# Patient Record
Sex: Male | Born: 1975 | Race: Black or African American | Hispanic: No | Smoking: Former smoker
Health system: Southern US, Community
[De-identification: ages and names within clinical notes are randomized; demographics above are authoritative.]

## PROBLEM LIST (undated history)

## (undated) DIAGNOSIS — N200 Calculus of kidney: Secondary | ICD-10-CM

## (undated) HISTORY — PX: NO PAST SURGERIES: SHX2092

## (undated) HISTORY — PX: KIDNEY STONE SURGERY: SHX686

---

## 1998-01-07 ENCOUNTER — Emergency Department (HOSPITAL_COMMUNITY): Admission: EM | Admit: 1998-01-07 | Discharge: 1998-01-07 | Payer: Self-pay | Admitting: *Deleted

## 1998-02-07 ENCOUNTER — Emergency Department (HOSPITAL_COMMUNITY): Admission: EM | Admit: 1998-02-07 | Discharge: 1998-02-07 | Payer: Self-pay | Admitting: Emergency Medicine

## 1998-02-07 ENCOUNTER — Encounter: Payer: Self-pay | Admitting: Emergency Medicine

## 1998-02-16 ENCOUNTER — Emergency Department (HOSPITAL_COMMUNITY): Admission: EM | Admit: 1998-02-16 | Discharge: 1998-02-16 | Payer: Self-pay | Admitting: Internal Medicine

## 1999-06-06 ENCOUNTER — Emergency Department (HOSPITAL_COMMUNITY): Admission: EM | Admit: 1999-06-06 | Discharge: 1999-06-06 | Payer: Self-pay | Admitting: Emergency Medicine

## 1999-06-06 ENCOUNTER — Encounter: Payer: Self-pay | Admitting: Emergency Medicine

## 2000-04-06 ENCOUNTER — Emergency Department (HOSPITAL_COMMUNITY): Admission: EM | Admit: 2000-04-06 | Discharge: 2000-04-06 | Payer: Self-pay | Admitting: Emergency Medicine

## 2001-07-27 ENCOUNTER — Emergency Department (HOSPITAL_COMMUNITY): Admission: EM | Admit: 2001-07-27 | Discharge: 2001-07-27 | Payer: Self-pay | Admitting: *Deleted

## 2001-07-27 ENCOUNTER — Encounter: Payer: Self-pay | Admitting: *Deleted

## 2001-07-31 ENCOUNTER — Encounter: Payer: Self-pay | Admitting: Emergency Medicine

## 2001-07-31 ENCOUNTER — Emergency Department (HOSPITAL_COMMUNITY): Admission: EM | Admit: 2001-07-31 | Discharge: 2001-07-31 | Payer: Self-pay | Admitting: Emergency Medicine

## 2003-01-16 ENCOUNTER — Emergency Department (HOSPITAL_COMMUNITY): Admission: EM | Admit: 2003-01-16 | Discharge: 2003-01-16 | Payer: Self-pay | Admitting: Emergency Medicine

## 2003-11-09 ENCOUNTER — Emergency Department (HOSPITAL_COMMUNITY): Admission: EM | Admit: 2003-11-09 | Discharge: 2003-11-09 | Payer: Self-pay | Admitting: Emergency Medicine

## 2004-02-28 ENCOUNTER — Emergency Department (HOSPITAL_COMMUNITY): Admission: EM | Admit: 2004-02-28 | Discharge: 2004-02-28 | Payer: Self-pay | Admitting: Family Medicine

## 2004-10-30 ENCOUNTER — Emergency Department (HOSPITAL_COMMUNITY): Admission: EM | Admit: 2004-10-30 | Discharge: 2004-10-30 | Payer: Self-pay | Admitting: Emergency Medicine

## 2006-04-01 ENCOUNTER — Emergency Department (HOSPITAL_COMMUNITY): Admission: EM | Admit: 2006-04-01 | Discharge: 2006-04-01 | Payer: Self-pay | Admitting: Emergency Medicine

## 2006-04-09 ENCOUNTER — Emergency Department (HOSPITAL_COMMUNITY): Admission: EM | Admit: 2006-04-09 | Discharge: 2006-04-09 | Payer: Self-pay | Admitting: Family Medicine

## 2006-06-08 ENCOUNTER — Emergency Department (HOSPITAL_COMMUNITY): Admission: EM | Admit: 2006-06-08 | Discharge: 2006-06-08 | Payer: Self-pay | Admitting: *Deleted

## 2007-12-25 ENCOUNTER — Emergency Department (HOSPITAL_COMMUNITY): Admission: EM | Admit: 2007-12-25 | Discharge: 2007-12-25 | Payer: Self-pay | Admitting: Emergency Medicine

## 2007-12-31 ENCOUNTER — Emergency Department (HOSPITAL_COMMUNITY): Admission: EM | Admit: 2007-12-31 | Discharge: 2007-12-31 | Payer: Self-pay | Admitting: Emergency Medicine

## 2008-04-15 ENCOUNTER — Emergency Department (HOSPITAL_COMMUNITY): Admission: EM | Admit: 2008-04-15 | Discharge: 2008-04-15 | Payer: Self-pay | Admitting: Emergency Medicine

## 2008-08-23 ENCOUNTER — Emergency Department (HOSPITAL_COMMUNITY): Admission: EM | Admit: 2008-08-23 | Discharge: 2008-08-24 | Payer: Self-pay | Admitting: Emergency Medicine

## 2010-05-09 LAB — LIPASE, BLOOD: Lipase: 21 U/L (ref 11–59)

## 2010-05-09 LAB — COMPREHENSIVE METABOLIC PANEL
AST: 23 U/L (ref 0–37)
Alkaline Phosphatase: 55 U/L (ref 39–117)
CO2: 25 mEq/L (ref 19–32)
Calcium: 9.5 mg/dL (ref 8.4–10.5)
Chloride: 104 mEq/L (ref 96–112)
Creatinine, Ser: 0.98 mg/dL (ref 0.4–1.5)
GFR calc Af Amer: >60 mL/min (ref >60)
Sodium: 138 mEq/L (ref 135–145)
Total Bilirubin: 0.8 mg/dL (ref 0.3–1.2)
Total Protein: 7.5 g/dL (ref 6.0–8.3)

## 2010-05-09 LAB — DIFFERENTIAL
Basophils Relative: 0 % (ref 0–1)
Eosinophils Absolute: 0 10*3/uL (ref 0.0–0.7)
Lymphocytes Relative: 5 % — ABNORMAL LOW (ref 12–46)
Neutrophils Relative %: 89 % — ABNORMAL HIGH (ref 43–77)

## 2010-05-09 LAB — CBC
Platelets: 144 10*3/uL — ABNORMAL LOW (ref 150–400)
RDW: 13.5 % (ref 11.5–15.5)

## 2010-12-09 ENCOUNTER — Encounter: Payer: Self-pay | Admitting: *Deleted

## 2010-12-09 ENCOUNTER — Emergency Department (HOSPITAL_COMMUNITY)
Admission: EM | Admit: 2010-12-09 | Discharge: 2010-12-09 | Attending: Emergency Medicine | Admitting: Emergency Medicine

## 2010-12-09 ENCOUNTER — Other Ambulatory Visit: Payer: Self-pay

## 2010-12-09 ENCOUNTER — Emergency Department (HOSPITAL_COMMUNITY)

## 2010-12-09 DIAGNOSIS — R110 Nausea: Secondary | ICD-10-CM | POA: Insufficient documentation

## 2010-12-09 DIAGNOSIS — R0789 Other chest pain: Secondary | ICD-10-CM | POA: Insufficient documentation

## 2010-12-09 DIAGNOSIS — R55 Syncope and collapse: Secondary | ICD-10-CM | POA: Insufficient documentation

## 2010-12-09 HISTORY — DX: Calculus of kidney: N20.0

## 2010-12-09 LAB — DIFFERENTIAL
Basophils Absolute: 0 10*3/uL (ref 0.0–0.1)
Basophils Relative: 0 % (ref 0–1)
Eosinophils Absolute: 0.1 10*3/uL (ref 0.0–0.7)
Eosinophils Relative: 1 % (ref 0–5)
Lymphocytes Relative: 8 % — ABNORMAL LOW (ref 12–46)
Lymphs Abs: 0.6 10*3/uL — ABNORMAL LOW (ref 0.7–4.0)
Monocytes Absolute: 0.4 10*3/uL (ref 0.1–1.0)
Monocytes Relative: 5 % (ref 3–12)
Neutro Abs: 6.6 10*3/uL (ref 1.7–7.7)
Neutrophils Relative %: 87 % — ABNORMAL HIGH (ref 43–77)

## 2010-12-09 LAB — BASIC METABOLIC PANEL
BUN: 14 mg/dL (ref 6–23)
CO2: 29 mEq/L (ref 19–32)
Calcium: 9.2 mg/dL (ref 8.4–10.5)
Chloride: 103 mEq/L (ref 96–112)
Creatinine, Ser: 1.07 mg/dL (ref 0.50–1.35)
GFR calc Af Amer: >90 mL/min (ref >90)
GFR calc non Af Amer: 89 mL/min — ABNORMAL LOW (ref >90)
Glucose, Bld: 116 mg/dL — ABNORMAL HIGH (ref 70–99)
Potassium: 3.9 mEq/L (ref 3.5–5.1)
Sodium: 136 mEq/L (ref 135–145)

## 2010-12-09 LAB — CBC
Hemoglobin: 12.8 g/dL — ABNORMAL LOW (ref 13.0–17.0)
MCV: 88.7 fL (ref 78.0–100.0)
RBC: 4.35 MIL/uL (ref 4.22–5.81)

## 2010-12-09 LAB — URINALYSIS, ROUTINE W REFLEX MICROSCOPIC
Bilirubin Urine: NEGATIVE
Glucose, UA: NEGATIVE mg/dL
Hgb urine dipstick: NEGATIVE
Protein, ur: NEGATIVE mg/dL
Specific Gravity, Urine: 1.015 (ref 1.005–1.030)

## 2010-12-09 LAB — RAPID URINE DRUG SCREEN, HOSP PERFORMED
Amphetamines: NOT DETECTED
Barbiturates: NOT DETECTED
Benzodiazepines: NOT DETECTED
Cocaine: NOT DETECTED
Opiates: NOT DETECTED
Tetrahydrocannabinol: NOT DETECTED

## 2010-12-09 LAB — LIPASE, BLOOD: Lipase: 44 U/L (ref 11–59)

## 2010-12-09 MED ORDER — SODIUM CHLORIDE 0.9 % IV BOLUS (SEPSIS)
1000.0000 mL | Freq: Once | INTRAVENOUS | Status: AC
  Administered 2010-12-09: 1000 mL via INTRAVENOUS

## 2010-12-09 NOTE — ED Notes (Signed)
Pt arrived via EMS.  Reports "they thought he was having a seizure."
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
## 2010-12-09 NOTE — ED Provider Notes (Signed)
History     CSN: [id number] Arrival date & time: 12/09/2010  1:01 AM   First MD Initiated Contact with Patient 12/09/10 0122      Chief Complaint  Patient presents with  . Seizures    (Consider location/radiation/quality/duration/timing/severity/associated sxs/prior treatment) Patient is a 35 y.o. male presenting with seizures. The history is provided by the patient.  Seizures  This is a new problem. The current episode started 3 to 5 hours ago. The problem has been gradually improving. Number of times: 0. Associated symptoms include confusion. Pertinent negatives include no speech difficulty, no visual disturbance, no neck stiffness, no sore throat, no chest pain, no cough, no vomiting and no diarrhea. Characteristics include loss of consciousness. Characteristics do not include bowel incontinence. tremors, no rhythmic jerking The episode was witnessed. The seizures did not continue in the ED. Possible causes do not include med or dosage change, missed seizure meds or change in alcohol use. There has been no fever. There were no medications administered prior to arrival.    Past Medical History  Diagnosis Date  . Kidney stones     History reviewed. No pertinent past surgical history.  History reviewed. No pertinent family history.  History  Substance Use Topics  . Smoking status: Never Smoker   . Smokeless tobacco: Not on file  . Alcohol Use: No      Review of Systems  Constitutional: Negative for activity change.       All ROS Neg except as noted in HPI  HENT: Negative for nosebleeds, sore throat and neck pain.   Eyes: Negative for photophobia, discharge and visual disturbance.  Respiratory: Negative for cough, shortness of breath and wheezing.   Cardiovascular: Negative for chest pain and palpitations.  Gastrointestinal: Negative for vomiting, abdominal pain, diarrhea, blood in stool and bowel incontinence.  Genitourinary: Negative for dysuria, frequency and  hematuria.  Musculoskeletal: Negative for back pain and arthralgias.  Skin: Negative.   Neurological: Positive for seizures and loss of consciousness. Negative for dizziness and speech difficulty.  Psychiatric/Behavioral: Positive for confusion. Negative for hallucinations.    Allergies  Review of patient's allergies indicates no known allergies.  Home Medications  No current outpatient prescriptions on file.  BP 130/74  Pulse 91  Temp 98.3 F (36.8 C)  Resp 18  Ht 5\' 4"  (1.626 m)  Wt 180 lb (81.647 kg)  BMI 30.90 kg/m2  SpO2 100%  Physical Exam  Nursing note and vitals reviewed. Constitutional: He is oriented to person, place, and time. He appears well-developed and well-nourished.  Non-toxic appearance.  HENT:  Head: Normocephalic.  Right Ear: Tympanic membrane and external ear normal.  Left Ear: Tympanic membrane and external ear normal.  Eyes: EOM and lids are normal. Pupils are equal, round, and reactive to light.  Neck: Normal range of motion. Neck supple. Carotid bruit is not present.  Cardiovascular: Normal rate, regular rhythm, normal heart sounds, intact distal pulses and normal pulses.   Pulmonary/Chest: Breath sounds normal. No respiratory distress.  Abdominal: Soft. Bowel sounds are normal. There is no tenderness. There is no guarding.  Musculoskeletal: Normal range of motion.  Lymphadenopathy:       Head (right side): No submandibular adenopathy present.       Head (left side): No submandibular adenopathy present.    He has no cervical adenopathy.  Neurological: He is alert and oriented to person, place, and time. He has normal strength. No cranial nerve deficit or sensory deficit.  Skin: Skin is warm and  dry.  Psychiatric: He has a normal mood and affect. His speech is normal.    ED Course: 0227 - Pt's care to be continued by Dr Read Drivers.  Procedures (including critical care time)  Labs Reviewed  CBC - Abnormal; Notable for the following:    Hemoglobin  12.8 (*)    HCT 38.6 (*)    Platelets 142 (*)    All other components within normal limits  URINALYSIS, ROUTINE W REFLEX MICROSCOPIC  BASIC METABOLIC PANEL  URINE RAPID DRUG SCREEN (HOSP PERFORMED)  LIPASE, BLOOD   No results found.   No diagnosis found.    MDM  I have reviewed nursing notes, vital signs, and all appropriate lab and imaging results for this patient.  Results for orders placed during the hospital encounter of 12/09/10  CBC      Component Value Range   WBC 7.9  4.0 - 10.5 (K/uL)   RBC 4.35  4.22 - 5.81 (MIL/uL)   Hemoglobin 12.8 (*) 13.0 - 17.0 (g/dL)   HCT 16.1 (*) 09.6 - 52.0 (%)   MCV 88.7  78.0 - 100.0 (fL)   MCH 29.4  26.0 - 34.0 (pg)   MCHC 33.2  30.0 - 36.0 (g/dL)   RDW 04.5  40.9 - 81.1 (%)   Platelets 142 (*) 150 - 400 (K/uL)  URINALYSIS, ROUTINE W REFLEX MICROSCOPIC      Component Value Range   Color, Urine YELLOW  YELLOW    Appearance CLEAR  CLEAR    Specific Gravity, Urine 1.015  1.005 - 1.030    pH 6.5  5.0 - 8.0    Glucose, UA NEGATIVE  NEGATIVE (mg/dL)   Hgb urine dipstick NEGATIVE  NEGATIVE    Bilirubin Urine NEGATIVE  NEGATIVE    Ketones, ur TRACE (*) NEGATIVE (mg/dL)   Protein, ur NEGATIVE  NEGATIVE (mg/dL)   Urobilinogen, UA 0.2  0.0 - 1.0 (mg/dL)   Nitrite NEGATIVE  NEGATIVE    Leukocytes, UA NEGATIVE  NEGATIVE   BASIC METABOLIC PANEL      Component Value Range   Sodium 136  135 - 145 (mEq/L)   Potassium 3.9  3.5 - 5.1 (mEq/L)   Chloride 103  96 - 112 (mEq/L)   CO2 29  19 - 32 (mEq/L)   Glucose, Bld 116 (*) 70 - 99 (mg/dL)   BUN 14  6 - 23 (mg/dL)   Creatinine, Ser 9.14  0.50 - 1.35 (mg/dL)   Calcium 9.2  8.4 - 78.2 (mg/dL)   GFR calc non Af Amer 89 (*) >90 (mL/min)   GFR calc Af Amer >90  >90 (mL/min)  URINE RAPID DRUG SCREEN (HOSP PERFORMED)      Component Value Range   Opiates NONE DETECTED  NONE DETECTED    Cocaine NONE DETECTED  NONE DETECTED    Benzodiazepines NONE DETECTED  NONE DETECTED    Amphetamines NONE  DETECTED  NONE DETECTED    Tetrahydrocannabinol NONE DETECTED  NONE DETECTED    Barbiturates NONE DETECTED  NONE DETECTED   LIPASE, BLOOD      Component Value Range   Lipase 44  11 - 59 (U/L)  DIFFERENTIAL      Component Value Range   Neutrophils Relative 87 (*) 43 - 77 (%)   Neutro Abs 6.6  1.7 - 7.7 (K/uL)   Lymphocytes Relative 8 (*) 12 - 46 (%)   Lymphs Abs 0.6 (*) 0.7 - 4.0 (K/uL)   Monocytes Relative 5  3 - 12 (%)  Monocytes Absolute 0.4  0.1 - 1.0 (K/uL)   Eosinophils Relative 1  0 - 5 (%)   Eosinophils Absolute 0.1  0.0 - 0.7 (K/uL)   Basophils Relative 0  0 - 1 (%)   Basophils Absolute 0.0  0.0 - 0.1 (K/uL)   Dg Chest 2 View  12/09/2010  *RADIOLOGY REPORT*  Clinical Data: Chest pressure and nausea; seizure and syncope.  CHEST - 2 VIEW  Comparison: Chest radiograph performed 12/25/2007  Findings: The lungs are well-aerated and clear.  There is no evidence of focal opacification, pleural effusion or pneumothorax.  The heart is normal in size; the mediastinal contour is within normal limits.  No acute osseous abnormalities are seen.  IMPRESSION: No acute cardiopulmonary process seen.  No displaced rib fractures identified.  Original Report Authenticated By: Tonia Ghent, M.D.         Kathie Dike, [place] 12/13/10 [id number]

## 2010-12-09 NOTE — ED Provider Notes (Addendum)
Medical screening examination/treatment/procedure(s) were performed by non-physician practitioner and as supervising physician I was immediately available for consultation/collaboration.  3:18 AM Awake, alert in NAD. States he feels much better. He relates that he felt hot and nauseated before he passed out. Evaluation of his laboratory studies and chest x-ray are reassuring his vital signs are normal. History is more consistent with syncope than seizure.  Nursing notes and vitals signs, including pulse oximetry, reviewed.  Summary of this visit's results, reviewed by myself:  Labs:  Results for orders placed during the hospital encounter of 12/09/10  CBC      Component Value Range   WBC 7.9  4.0 - 10.5 (K/uL)   RBC 4.35  4.22 - 5.81 (MIL/uL)   Hemoglobin 12.8 (*) 13.0 - 17.0 (g/dL)   HCT 16.1 (*) 09.6 - 52.0 (%)   MCV 88.7  78.0 - 100.0 (fL)   MCH 29.4  26.0 - 34.0 (pg)   MCHC 33.2  30.0 - 36.0 (g/dL)   RDW 04.5  40.9 - 81.1 (%)   Platelets 142 (*) 150 - 400 (K/uL)  URINALYSIS, ROUTINE W REFLEX MICROSCOPIC      Component Value Range   Color, Urine YELLOW  YELLOW    Appearance CLEAR  CLEAR    Specific Gravity, Urine 1.015  1.005 - 1.030    pH 6.5  5.0 - 8.0    Glucose, UA NEGATIVE  NEGATIVE (mg/dL)   Hgb urine dipstick NEGATIVE  NEGATIVE    Bilirubin Urine NEGATIVE  NEGATIVE    Ketones, ur TRACE (*) NEGATIVE (mg/dL)   Protein, ur NEGATIVE  NEGATIVE (mg/dL)   Urobilinogen, UA 0.2  0.0 - 1.0 (mg/dL)   Nitrite NEGATIVE  NEGATIVE    Leukocytes, UA NEGATIVE  NEGATIVE   BASIC METABOLIC PANEL      Component Value Range   Sodium 136  135 - 145 (mEq/L)   Potassium 3.9  3.5 - 5.1 (mEq/L)   Chloride 103  96 - 112 (mEq/L)   CO2 29  19 - 32 (mEq/L)   Glucose, Bld 116 (*) 70 - 99 (mg/dL)   BUN 14  6 - 23 (mg/dL)   Creatinine, Ser 9.14  0.50 - 1.35 (mg/dL)   Calcium 9.2  8.4 - 78.2 (mg/dL)   GFR calc non Af Amer 89 (*) >90 (mL/min)   GFR calc Af Amer >90  >90 (mL/min)  URINE RAPID  DRUG SCREEN (HOSP PERFORMED)      Component Value Range   Opiates NONE DETECTED  NONE DETECTED    Cocaine NONE DETECTED  NONE DETECTED    Benzodiazepines NONE DETECTED  NONE DETECTED    Amphetamines NONE DETECTED  NONE DETECTED    Tetrahydrocannabinol NONE DETECTED  NONE DETECTED    Barbiturates NONE DETECTED  NONE DETECTED   LIPASE, BLOOD      Component Value Range   Lipase 44  11 - 59 (U/L)  DIFFERENTIAL      Component Value Range   Neutrophils Relative 87 (*) 43 - 77 (%)   Neutro Abs 6.6  1.7 - 7.7 (K/uL)   Lymphocytes Relative 8 (*) 12 - 46 (%)   Lymphs Abs 0.6 (*) 0.7 - 4.0 (K/uL)   Monocytes Relative 5  3 - 12 (%)   Monocytes Absolute 0.4  0.1 - 1.0 (K/uL)   Eosinophils Relative 1  0 - 5 (%)   Eosinophils Absolute 0.1  0.0 - 0.7 (K/uL)   Basophils Relative 0  0 - 1 (%)  Basophils Absolute 0.0  0.0 - 0.1 (K/uL)    Imaging Studies: Dg Chest 2 View  12/09/2010  *RADIOLOGY REPORT*  Clinical Data: Chest pressure and nausea; seizure and syncope.  CHEST - 2 VIEW  Comparison: Chest radiograph performed 12/25/2007  Findings: The lungs are well-aerated and clear.  There is no evidence of focal opacification, pleural effusion or pneumothorax.  The heart is normal in size; the mediastinal contour is within normal limits.  No acute osseous abnormalities are seen.  IMPRESSION: No acute cardiopulmonary process seen.  No displaced rib fractures identified.  Original Report Authenticated By: Tonia Ghent, M.D.   EKG Interpretation:  Date & Time: 12/09/2010 2:57 AM  Rate: 86  Rhythm: normal sinus rhythm  QRS Axis: normal  Intervals: normal  ST/T Wave abnormalities: nonspecific ST changes  Conduction Disutrbances:none  Narrative Interpretation:   Old EKG Reviewed: Unchanged except for premature atrial complex seen on prior EKG      Hanley Seamen, MD 12/09/10 1610  Hanley Seamen, MD 12/09/10 [phone]

## 2010-12-16 NOTE — ED Provider Notes (Signed)
Medical screening examination/treatment/procedure(s) were performed by non-physician practitioner and as supervising physician I was immediately available for consultation/collaboration.   Hanley Seamen, MD 12/16/10 2256

## 2012-05-03 ENCOUNTER — Emergency Department (HOSPITAL_COMMUNITY)
Admission: EM | Admit: 2012-05-03 | Discharge: 2012-05-03 | Disposition: A | Discharge status: Home / Self Care | Payer: No Typology Code available for payment source | Attending: Emergency Medicine | Admitting: Emergency Medicine

## 2012-05-03 ENCOUNTER — Encounter (HOSPITAL_COMMUNITY): Payer: Self-pay | Admitting: Emergency Medicine

## 2012-05-03 DIAGNOSIS — Y939 Activity, unspecified: Secondary | ICD-10-CM | POA: Insufficient documentation

## 2012-05-03 DIAGNOSIS — S161XXA Strain of muscle, fascia and tendon at neck level, initial encounter: Secondary | ICD-10-CM

## 2012-05-03 DIAGNOSIS — R252 Cramp and spasm: Secondary | ICD-10-CM

## 2012-05-03 DIAGNOSIS — IMO0002 Reserved for concepts with insufficient information to code with codable children: Secondary | ICD-10-CM | POA: Insufficient documentation

## 2012-05-03 DIAGNOSIS — Z87442 Personal history of urinary calculi: Secondary | ICD-10-CM | POA: Insufficient documentation

## 2012-05-03 DIAGNOSIS — Y9289 Other specified places as the place of occurrence of the external cause: Secondary | ICD-10-CM | POA: Insufficient documentation

## 2012-05-03 DIAGNOSIS — S139XXA Sprain of joints and ligaments of unspecified parts of neck, initial encounter: Secondary | ICD-10-CM | POA: Insufficient documentation

## 2012-05-03 MED ORDER — KETOROLAC TROMETHAMINE 60 MG/2ML IM SOLN
60.0000 mg | Freq: Once | INTRAMUSCULAR | Status: AC
  Administered 2012-05-03: 60 mg via INTRAMUSCULAR
  Filled 2012-05-03: qty 2

## 2012-05-03 MED ORDER — IBUPROFEN 600 MG PO TABS: 600.0000 mg | ORAL_TABLET | Freq: Four times a day (QID) | ORAL | Status: DC | PRN

## 2012-05-03 MED ORDER — METHOCARBAMOL 500 MG PO TABS: 500.0000 mg | ORAL_TABLET | Freq: Two times a day (BID) | ORAL | Status: DC
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
## 2012-05-04 NOTE — ED Provider Notes (Signed)
Medical screening examination/treatment/procedure(s) were performed by non-physician practitioner and as supervising physician I was immediately available for consultation/collaboration.  Toy Baker, MD 05/04/12 [phone]

## 2012-08-21 ENCOUNTER — Emergency Department (HOSPITAL_COMMUNITY): Payer: Self-pay

## 2012-08-21 ENCOUNTER — Encounter (HOSPITAL_COMMUNITY): Payer: Self-pay | Admitting: Emergency Medicine

## 2012-08-21 ENCOUNTER — Emergency Department (HOSPITAL_COMMUNITY)
Admission: EM | Admit: 2012-08-21 | Discharge: 2012-08-21 | Disposition: A | Discharge status: Home / Self Care | Payer: Self-pay | Attending: Emergency Medicine | Admitting: Emergency Medicine

## 2012-08-21 DIAGNOSIS — R141 Gas pain: Secondary | ICD-10-CM | POA: Insufficient documentation

## 2012-08-21 DIAGNOSIS — R142 Eructation: Secondary | ICD-10-CM | POA: Insufficient documentation

## 2012-08-21 DIAGNOSIS — R630 Anorexia: Secondary | ICD-10-CM | POA: Insufficient documentation

## 2012-08-21 DIAGNOSIS — Z87442 Personal history of urinary calculi: Secondary | ICD-10-CM | POA: Insufficient documentation

## 2012-08-21 LAB — POCT I-STAT, CHEM 8
Creatinine, Ser: 1.2 mg/dL (ref 0.50–1.35)
Hemoglobin: 13.6 g/dL (ref 13.0–17.0)
Potassium: 3.9 mEq/L (ref 3.5–5.1)
Sodium: 138 mEq/L (ref 135–145)

## 2012-08-21 LAB — CBC WITH DIFFERENTIAL/PLATELET
Eosinophils Absolute: 0.2 10*3/uL (ref 0.0–0.7)
Hemoglobin: 13.6 g/dL (ref 13.0–17.0)
Lymphocytes Relative: 31 % (ref 12–46)
Lymphs Abs: 2.5 10*3/uL (ref 0.7–4.0)
MCH: 32.5 pg (ref 26.0–34.0)
Monocytes Relative: 10 % (ref 3–12)
Neutro Abs: 4.5 10*3/uL (ref 1.7–7.7)
Neutrophils Relative %: 56 % (ref 43–77)
Platelets: 162 10*3/uL (ref 150–400)
RBC: 4.19 MIL/uL — ABNORMAL LOW (ref 4.22–5.81)
WBC: 8.1 10*3/uL (ref 4.0–10.5)

## 2012-08-21 MED ORDER — IOHEXOL 300 MG/ML  SOLN
20.0000 mL | INTRAMUSCULAR | Status: AC
  Administered 2012-08-21: 50 mL via ORAL

## 2012-08-21 MED ORDER — DOCUSATE SODIUM 100 MG PO CAPS: 100.0000 mg | ORAL_CAPSULE | Freq: Two times a day (BID) | ORAL | Status: DC

## 2012-08-21 MED ORDER — SODIUM CHLORIDE 0.9 % IV BOLUS (SEPSIS)
1000.0000 mL | Freq: Once | INTRAVENOUS | Status: AC
  Administered 2012-08-21: 1000 mL via INTRAVENOUS

## 2012-08-21 NOTE — ED Notes (Signed)
Pt reports decrease appetite x 4 days. Pt reports not being able to eat anything for past 4 days, Pt has been drinking fluids.

## 2012-08-21 NOTE — ED Notes (Signed)
Patient transported to X-ray 
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
## 2013-02-03 ENCOUNTER — Emergency Department (HOSPITAL_COMMUNITY)
Admission: EM | Admit: 2013-02-03 | Discharge: 2013-02-03 | Disposition: A | Discharge status: Home / Self Care | Attending: Emergency Medicine | Admitting: Emergency Medicine

## 2013-02-03 ENCOUNTER — Encounter (HOSPITAL_COMMUNITY): Payer: Self-pay | Admitting: Emergency Medicine

## 2013-02-03 DIAGNOSIS — G8929 Other chronic pain: Secondary | ICD-10-CM | POA: Insufficient documentation

## 2013-02-03 DIAGNOSIS — M25511 Pain in right shoulder: Secondary | ICD-10-CM

## 2013-02-03 DIAGNOSIS — R21 Rash and other nonspecific skin eruption: Secondary | ICD-10-CM | POA: Insufficient documentation

## 2013-02-03 DIAGNOSIS — B353 Tinea pedis: Secondary | ICD-10-CM | POA: Insufficient documentation

## 2013-02-03 DIAGNOSIS — M25519 Pain in unspecified shoulder: Secondary | ICD-10-CM | POA: Insufficient documentation

## 2013-02-03 DIAGNOSIS — B351 Tinea unguium: Secondary | ICD-10-CM | POA: Insufficient documentation

## 2013-02-03 MED ORDER — TERBINAFINE HCL 1 % EX CREA: 1.0000 "application " | TOPICAL_CREAM | Freq: Two times a day (BID) | CUTANEOUS | Status: DC

## 2013-02-03 MED ORDER — NAPROXEN 500 MG PO TABS: 500.0000 mg | ORAL_TABLET | Freq: Two times a day (BID) | ORAL | Status: DC

## 2013-02-03 NOTE — ED Provider Notes (Signed)
Medical screening examination/treatment/procedure(s) were performed by non-physician practitioner and as supervising physician I was immediately available for consultation/collaboration.  EKG Interpretation   None        Juliet RudeNathan R. Rubin PayorPickering, MD 02/03/13 [phone]

## 2013-02-03 NOTE — Discharge Instructions (Signed)
Sling for comfort. Take Naprosyn as prescribed as needed for pain and inflammation. Start applying Lamisil cream to her feet twice a day. Followup with a primary care doctor or orthopedic specialist is referred.  Shoulder Pain The shoulder is the joint that connects your arms to your body. The bones that form the shoulder joint include the upper arm bone (humerus), the shoulder blade (scapula), and the collarbone (clavicle). The top of the humerus is shaped like a ball and fits into a rather flat socket on the scapula (glenoid cavity). A combination of muscles and strong, fibrous tissues that connect muscles to bones (tendons) support your shoulder joint and hold the ball in the socket. Small, fluid-filled sacs (bursae) are located in different areas of the joint. They act as cushions between the bones and the overlying soft tissues and help reduce friction between the gliding tendons and the bone as you move your arm. Your shoulder joint allows a  wide range of motion in your arm. This range of motion allows you to do things like scratch your back or throw a ball. However, this range of motion also makes your shoulder more prone to pain from overuse and injury. Causes of shoulder pain can originate from both injury and overuse and usually can be grouped in the following four categories:  Redness, swelling, and pain (inflammation) of the tendon (tendinitis) or the bursae (bursitis).  Instability, such as a dislocation of the joint.  Inflammation of the joint (arthritis).  Broken bone (fracture). HOME CARE INSTRUCTIONS   Apply ice to the sore area.  Put ice in a plastic bag.  Place a towel between your skin and the bag.  Leave the ice on for 15-20 minutes, 03-04 times per day for the first 2 days.  Stop using cold packs if they do not help with the pain.  If you have a shoulder sling or immobilizer, wear it as long as your caregiver instructs. Only remove it to shower or bathe. Move your arm  as little as possible, but keep your hand moving to prevent swelling.  Squeeze a soft ball or foam pad as much as possible to help prevent swelling.  Only take over-the-counter or prescription medicines for pain, discomfort, or fever as directed by your caregiver. SEEK MEDICAL CARE IF:   Your shoulder pain increases, or new pain develops in your arm, hand, or fingers.  Your hand or fingers become cold and numb.  Your pain is not relieved with medicines. SEEK IMMEDIATE MEDICAL CARE IF:   Your arm, hand, or fingers are numb or tingling.  Your arm, hand, or fingers are significantly swollen or turn white or blue. MAKE SURE YOU:   Understand these instructions.  Will watch your condition.  Will get help right away if you are not doing well or get worse. Document Released: 10/23/2004 Document Revised: 10/08/2011 Document Reviewed: 12/28/2010 [facility] Patient Information 2014 [place], [place].  Athlete's Foot Athlete's foot (tinea pedis) is a fungal infection of the skin on the feet. It often occurs on the skin between the toes or underneath the toes. It can also occur on the soles of the feet. Athlete's foot is more likely to occur in hot, humid weather. Not washing your feet or changing your socks often enough can contribute to athlete's foot. The infection can spread from person to person (contagious). CAUSES Athlete's foot is caused by a fungus. This fungus thrives in warm, moist places. Most people get athlete's foot by sharing shower stalls, towels, and  wet floors with an infected person. People with weakened immune systems, including those with diabetes, may be more likely to get athlete's foot. SYMPTOMS   Itchy areas between the toes or on the soles of the feet.  White, flaky, or scaly areas between the toes or on the soles of the feet.  Tiny, intensely itchy blisters between the toes or on the soles of the feet.  Tiny cuts on the skin. These cuts can develop a bacterial  infection.  Thick or discolored toenails. DIAGNOSIS  Your caregiver can usually tell what the problem is by doing a physical exam. Your caregiver may also take a skin sample from the rash area. The skin sample may be examined under a microscope, or it may be tested to see if fungus will grow in the sample. A sample may also be taken from your toenail for testing. TREATMENT  Over-the-counter and prescription medicines can be used to kill the fungus. These medicines are available as powders or creams. Your caregiver can suggest medicines for you. Fungal infections respond slowly to treatment. You may need to continue using your medicine for several weeks. PREVENTION   Do not share towels.  Wear sandals in wet areas, such as shared locker rooms and shared showers.  Keep your feet dry. Wear shoes that allow air to circulate. Wear cotton or wool socks. HOME CARE INSTRUCTIONS   Take medicines as directed by your caregiver. Do not use steroid creams on athlete's foot.  Keep your feet clean and cool. Wash your feet daily and dry them thoroughly, especially between your toes.  Change your socks every day. Wear cotton or wool socks. In hot climates, you may need to change your socks 2 to 3 times per day.  Wear sandals or canvas tennis shoes with good air circulation.  If you have blisters, soak your feet in Burow's solution or Epsom salts for 20 to 30 minutes, 2 times a day to dry out the blisters. Make sure you dry your feet thoroughly afterward. SEEK MEDICAL CARE IF:   You have a fever.  You have swelling, soreness, warmth, or redness in your foot.  You are not getting better after 7 days of treatment.  You are not completely cured after 30 days.  You have any problems caused by your medicines. MAKE SURE YOU:   Understand these instructions.  Will watch your condition.  Will get help right away if you are not doing well or get worse. Document Released: 01/11/2000 Document Revised:  04/07/2011 Document Reviewed: 11/01/2010 [facility] Patient Information 2014 [place].

## 2013-02-03 NOTE — Progress Notes (Signed)
[facility] provided pt with a list of primary care resources, [facility] information, and a [facility] Orange Card application.  °

## 2013-02-03 NOTE — ED Provider Notes (Signed)
CSN: [id number]     Arrival date & time 02/03/13  0909 History   First MD Initiated Contact with Patient 02/03/13 [phone]     Chief Complaint  Patient presents with  . Shoulder Pain    right   (Consider location/radiation/quality/duration/timing/severity/associated sxs/prior Treatment) HPI Brady Martin is a 38 y.o. male who presents emergency department with 2 different complaints. Patient states his main complaint is pain in the right shoulder. Patient states that he has had pain in the arm for years and it has gotten worse the last 5 months. Patient states that this morning he woke up and he couldn't raise his arm due to pain. Patient denies doing any strenuous activities yesterday. Denies working out. He denies any heavy lifting. He does not recall any injuries. He denies any numbness or weakness in the arm or hand. He denies any neck pain. He did not take any medications prior to coming in. He states movement of the arm makes pain worse. Nothing makes pain better. Pain is described as sharp. Patient's second complaint is a rash on his feet. Patient states that began years ago and is worsening. States deformity toenails and feet are very dry. He states he puts Vaseline on his feet after bathing.  Past Medical History  Diagnosis Date  . Kidney stones    History reviewed. No pertinent past surgical history. History reviewed. No pertinent family history. History  Substance Use Topics  . Smoking status: Never Smoker   . Smokeless tobacco: Never Used  . Alcohol Use: No    Review of Systems  Constitutional: Negative for fever and chills.  Respiratory: Negative.   Cardiovascular: Negative.   Musculoskeletal: Positive for arthralgias and myalgias. Negative for neck pain and neck stiffness.  Skin: Positive for rash.  Neurological: Negative for weakness, numbness and headaches.    Allergies  Soy allergy and Wheat bran  Home Medications  No current outpatient prescriptions on file. BP  110/69  Pulse 71  Temp(Src) 98.3 F (36.8 C) (Oral)  Resp 17  SpO2 94% Physical Exam  Nursing note and vitals reviewed. Constitutional: He is oriented to person, place, and time. He appears well-developed and well-nourished. No distress.  HENT:  Head: Normocephalic and atraumatic.  Eyes: Conjunctivae are normal.  Neck: Normal range of motion. Neck supple.  Cardiovascular: Normal rate, regular rhythm and normal heart sounds.   Pulmonary/Chest: Effort normal and breath sounds normal. No respiratory distress. He has no wheezes. He has no rales.  Musculoskeletal: He exhibits no edema and no tenderness.  Tender to palpation over anterior and posterior right shoulder joint. Pain with any range of motion passively or actively of the shoulder past 90. Shoulder appears normal, it is not red or swollen, it is not warm to the touch. Positive straight arm drop test. Normal right elbow and hand. The grip strengths are equal bilaterally and 5 out of 5. Distal radial pulses intact.  Lymphadenopathy:    He has no cervical adenopathy.  Neurological: He is alert and oriented to person, place, and time.  Skin: Skin is warm and dry. No erythema.  Bilateral fungal nail infection as well as skin of the bilateral feet infection. Feet are dry, scaly  Psychiatric: He has a normal mood and affect.    ED Course  Procedures (including critical care time) Labs Review Labs Reviewed - No data to display Imaging Review No results found.  EKG Interpretation   None       MDM   1.  Right shoulder pain   2. Onychomycosis   3. Tinea pedis     Patient with right shoulder pain, nontraumatic, pain for over a year. No new injuries. No known injuries. Will start on anti-inflammatories, sling provided, follow with primary care Dr. orthopedic specialist. Suspect a rotator cuff injury. Patient also complaining of discoloration rash to bilateral feet. To me this looks like tinea pedis. Discussed that this infection  may not get better unless on oral anti fungals, but he will need LFTs check prior. Advised to follow up with PCP. For now, start lamisil cream bid daily.  Filed Vitals:   02/03/13 0918  BP: 110/69  Pulse: 71  Temp: 98.3 F (36.8 C)  TempSrc: Oral  Resp: 17  SpO2: 94%   #1  Myriam Jacobsonatyana A Dwayne Begay, PA-C 02/03/13 1611

## 2013-02-03 NOTE — ED Notes (Signed)
Patient states he has had chromic right shoulder pain for years and in the past 5 months has been getting worse. Patient states he woke this AM and could not raise his right arm.

## 2013-03-20 ENCOUNTER — Encounter (HOSPITAL_COMMUNITY): Payer: Self-pay | Admitting: Emergency Medicine

## 2013-03-20 ENCOUNTER — Emergency Department (HOSPITAL_COMMUNITY)
Admission: EM | Admit: 2013-03-20 | Discharge: 2013-03-20 | Disposition: A | Discharge status: Home / Self Care | Attending: Emergency Medicine | Admitting: Emergency Medicine

## 2013-03-20 DIAGNOSIS — Z79899 Other long term (current) drug therapy: Secondary | ICD-10-CM | POA: Insufficient documentation

## 2013-03-20 DIAGNOSIS — J329 Chronic sinusitis, unspecified: Secondary | ICD-10-CM

## 2013-03-20 DIAGNOSIS — IMO0002 Reserved for concepts with insufficient information to code with codable children: Secondary | ICD-10-CM | POA: Insufficient documentation

## 2013-03-20 DIAGNOSIS — Z87442 Personal history of urinary calculi: Secondary | ICD-10-CM | POA: Insufficient documentation

## 2013-03-20 DIAGNOSIS — J069 Acute upper respiratory infection, unspecified: Secondary | ICD-10-CM | POA: Insufficient documentation

## 2013-03-20 MED ORDER — CETIRIZINE HCL 10 MG PO TABS: 10.0000 mg | ORAL_TABLET | Freq: Every day | ORAL | Status: DC

## 2013-03-20 MED ORDER — AZITHROMYCIN 250 MG PO TABS: 250.0000 mg | ORAL_TABLET | Freq: Every day | ORAL | Status: DC

## 2013-03-20 MED ORDER — FLUTICASONE PROPIONATE 50 MCG/ACT NA SUSP: 1.0000 | Freq: Every day | NASAL | Status: DC

## 2013-03-20 NOTE — ED Provider Notes (Signed)
CSN: [id number]     Arrival date & time 03/20/13  0239 History   First MD Initiated Contact with Patient 03/20/13 0327     Chief Complaint  Patient presents with  . URI     (Consider location/radiation/quality/duration/timing/severity/associated sxs/prior Treatment) HPI Comments: 38 year old male who presents with one week of persistent moderate nasal congestion with copious amounts of discharge from his bilateral nostrils. This is thick white and yellow, symptoms are persistent, nothing makes it better or worse, no associated fevers. He has no cough, no swollen glands in his neck or lymphadenopathy and no abdominal pain nausea or vomiting. He has had no medication prior to arrival  Patient is a 38 y.o. male presenting with URI. The history is provided by the patient.  URI   Past Medical History  Diagnosis Date  . Kidney stones    History reviewed. No pertinent past surgical history. No family history on file. History  Substance Use Topics  . Smoking status: Never Smoker   . Smokeless tobacco: Never Used  . Alcohol Use: No    Review of Systems  All other systems reviewed and are negative.      Allergies  Wheat bran and Soy allergy  Home Medications   Current Outpatient Rx  Name  Route  Sig  Dispense  Refill  . terbinafine (LAMISIL AT) 1 % cream   Topical   Apply 1 application topically 2 (two) times daily.   30 g   0   . azithromycin (ZITHROMAX Z-PAK) 250 MG tablet   Oral   Take 1 tablet (250 mg total) by mouth daily. 500mg  PO day 1, then 250mg  PO days 205   6 tablet   0   . cetirizine (ZYRTEC ALLERGY) 10 MG tablet   Oral   Take 1 tablet (10 mg total) by mouth daily.   30 tablet   1   . fluticasone (FLONASE) 50 MCG/ACT nasal spray   Each Nare   Place 1 spray into both nostrils daily.   16 g   2    BP 113/66  Pulse 84  Temp(Src) 98.5 F (36.9 C) (Oral)  Resp 16  Ht 5\' 4"  (1.626 m)  Wt 190 lb (86.183 kg)  BMI 32.60 kg/m2  SpO2 95% Physical  Exam  Nursing note and vitals reviewed. Constitutional: He appears well-developed and well-nourished. No distress.  HENT:  Head: Normocephalic and atraumatic.  Mouth/Throat: No oropharyngeal exudate.  Purulent material in the bilateral nostrils, mild maxillary sinus tenderness, normal oropharynx, normal tonsils, normal tympanic membranes bilaterally  Eyes: Conjunctivae and EOM are normal. Pupils are equal, round, and reactive to light. Right eye exhibits no discharge. Left eye exhibits no discharge. No scleral icterus.  Neck: Normal range of motion. Neck supple. No JVD present. No thyromegaly present.  Cardiovascular: Normal rate, regular rhythm, normal heart sounds and intact distal pulses.  Exam reveals no gallop and no friction rub.   No murmur heard. Pulmonary/Chest: Effort normal and breath sounds normal. No respiratory distress. He has no wheezes. He has no rales.  Abdominal: Soft. Bowel sounds are normal. He exhibits no distension and no mass. There is no tenderness.  Musculoskeletal: Normal range of motion. He exhibits no edema and no tenderness.  Lymphadenopathy:    He has no cervical adenopathy.  Neurological: He is alert. Coordination normal.  Skin: Skin is warm and dry. No rash noted. No erythema.  Psychiatric: He has a normal mood and affect. His behavior is normal.  ED Course  Procedures (including critical care time) Labs Review Labs Reviewed - No data to display Imaging Review No results found.  EKG Interpretation   None       MDM   Final diagnoses:  Sinusitis  URI (upper respiratory infection)    The patient is well-appearing, stable, normal vital signs, likely has upper respiratory infection or early sinusitis, medications as below.   Meds given in ED:  Medications - No data to display  New Prescriptions   AZITHROMYCIN (ZITHROMAX Z-PAK) 250 MG TABLET    Take 1 tablet (250 mg total) by mouth daily. 500mg  PO day 1, then 250mg  PO days 205    CETIRIZINE (ZYRTEC ALLERGY) 10 MG TABLET    Take 1 tablet (10 mg total) by mouth daily.   FLUTICASONE (FLONASE) 50 MCG/ACT NASAL SPRAY    Place 1 spray into both nostrils daily.        Vida Roller, MD 03/20/13 [phone]

## 2013-03-20 NOTE — ED Notes (Signed)
Pt c/o nasal congestion with copious amounts of white/yellow drainage. Minimal cough, +fever, +intermittent nausea

## 2013-03-20 NOTE — Discharge Instructions (Signed)
°Emergency Department Resource Guide °1) Find a Doctor and Pay Out of Pocket °Although you won't have to find out who is covered by your insurance plan, it is a good idea to ask around and get recommendations. You will then need to call the office and see if the doctor you have chosen will accept you as a new patient and what types of options they offer for patients who are self-pay. Some doctors offer discounts or will set up payment plans for their patients who do not have insurance, but you will need to ask so you aren't surprised when you get to your appointment. ° °2) Contact Your Local Health Department °Not all health departments have doctors that can see patients for sick visits, but many do, so it is worth a call to see if yours does. If you don't know where your local health department is, you can check in your phone book. The CDC also has a tool to help you locate your state's health department, and many state websites also have listings of all of their local health departments. ° °3) Find a Walk-in Clinic °If your illness is not likely to be very severe or complicated, you may want to try a walk in clinic. These are popping up all over the country in pharmacies, drugstores, and shopping centers. They're usually staffed by nurse practitioners or physician assistants that have been trained to treat common illnesses and complaints. They're usually fairly quick and inexpensive. However, if you have serious medical issues or chronic medical problems, these are probably not your best option. ° °No Primary Care Doctor: °- Call [facility] at  832-8000 - they can help you locate a primary care doctor that  accepts your insurance, provides certain services, etc. °- Physician Referral Service- 1-800-533-3463 ° °Chronic Pain Problems: °Organization         Address  Phone   Notes  °[facility]  (336) 297-2271 Patients need to be referred by their primary care doctor.  ° °Medication  Assistance: °Organization         Address  Phone   Notes  °[place] [facility] [address] °[place], [place] [address] (336) 641-8030 --Must be a resident of [place] °-- Must have NO insurance coverage whatsoever (no Medicaid/ Medicare, etc.) °-- The pt. MUST have a primary care doctor that directs their care regularly and follows them in the community °  °[facility]  (866) 331-1348   °[employer]  (888) 892-1162   ° °Agencies that provide inexpensive medical care: °Organization         Address  Phone   Notes  °[facility]  (336) 832-8035   ° Internal Medicine    (336) 832-7272   °[facility] [address] °[place], [place] [address] (336) 832-4777   °[facility] [address], °[place] (336) 271-4999   °[facility]    (336) 373-0678   °[facility]    (336) 272-1050   °[facility] ° [address], [place] Phone:  (336) 832-4444, Fax:  (336) 832-4440 Hours of Operation:  9 am - 6 pm, M-F.  Also accepts Medicaid/Medicare and self-pay.  °[facility] ° [address], [place] Phone: (336) 832-3150, Fax: (336) 832-3151. Hours of Operation:  8:30 am - 5:30 pm, M-F.  Also accepts Medicaid and self-pay.  °[facility] [place] [address], [place] Phone: (336) 878-6027   °[facility] [address], [place], [place] (336)723-1848, Ext. 123 Mondays & Thursdays: 7-9 AM.  First 15 patients are seen on a first come, first serve basis. °  ° °Medicaid-accepting [place] Providers: ° °Organization         Address  Phone   Notes  °[facility] [address], [place] (336) 641-2100 Also accepts self-pay patients.  °[facility] [address], [place] ° (336) 856-9996   °[facility] [address], [place]  (336) 288-8857   °[facility] [address] [place] [address], [place] (336) 299-7000   °Veita Bland [address], [place]  ° (336) 373-1557 Only accepts [place] Medicaid patients after they have their name applied to their card.  ° °Self-Pay (no insurance) in [place]: ° °Organization         Address  Phone   Notes  °Sickle Cell Patients, [facility] [address], [place] (336) 832-1970   °[facility] [address], [place] (336) 832-4400   °[facility] ° [address], [place] (336) 992-4800   °[facility]/Dr. Osei-Bonsu ° [address] [place] [address], [place] or [address], [place] (336) 841-8500 Phone number for both [place] and [place] locations is the same.  °[facility] [address], [place] (336) 299-0000   °[facility] [address] [place] [address], [place] or [address] (336) 852-7530 °(336) 878-2260   °[facility] [address], [place] (336) 350-1642, phone; (336) 294-5005, fax Sees patients 1st and 3rd Saturday of every month.  Must not qualify for public or private insurance (i.e. Medicaid, Medicare, [place] Health Choice, Veterans' Benefits) • Household income should be no more than 200% of the poverty level •The clinic cannot treat you if you are pregnant or think you are pregnant • Sexually transmitted diseases are not treated at the clinic.  ° ° °Dental Care: °Organization         Address  Phone  Notes  °[place] [facility] [address], [place] (336) 641-6152 Accepts children up to age 21 who are enrolled in Medicaid or [place] Health Choice; pregnant women with a Medicaid card; and children who have applied for Medicaid or [place] Health Choice, but were declined, whose parents can pay a reduced fee at time of service.  °[place]  [facility] [place]  [address], [place] (336) 641-7733 Accepts children up to age 21 who are enrolled in Medicaid or [place] Health Choice; pregnant women with a Medicaid card; and children who have applied for Medicaid or [place] Health Choice, but were declined, whose parents can pay a reduced fee at time of service.  °[facility] ° [address], [place] (336) 641-4533 Patients are seen by appointment only. Walk-ins are not accepted. [facility] will see patients 18 years of age and older. °Monday - Tuesday (8am-5pm) °Most Wednesdays (8:30-5pm) °$30 per visit, cash only  °[facility] ° [address], [place] (336) 641-4533 Patients are seen by appointment only. Walk-ins are not accepted. [facility] will see patients 18 years of age and older. °One   Wednesday Evening (Monthly: Volunteer Based).  $30 per visit, cash only  °[facility]  (919) 537-3737 for adults; Children under age 4, call Graduate Pediatric Dentistry at ([phone]) [phone]. Children aged 4-14, please call ([phone]) [phone] to request a pediatric application. ° Dental services are provided in all areas of dental care including fillings, crowns and bridges, complete and partial dentures, implants, gum treatment, root canals, and extractions. Preventive care is also provided. Treatment is provided to both adults and children. °Patients are selected via a lottery and there is often a waiting list. °  °[facility] [address], °[place] ° (336) 763-8833 [email or web address] °  °[facility] [address], [place], [place] (336)723-1848, Ext. 123 Second and Fourth Thursday of each month, opens at 6:30 AM; Clinic ends at 9 AM.  Patients are seen on a first-come first-served basis, and a limited number are seen during each clinic.  ° °[facility] ° [address], [place], [place] (336) 723-7904    Eligibility Requirements °You must have lived in [place], [place], or [place] counties for at least the last three months. °  You cannot be eligible for state or federal sponsored healthcare insurance, including Veterans Administration, Medicaid, or Medicare. °  You generally cannot be eligible for healthcare insurance through your employer.  °  How to apply: °Eligibility screenings are held every Tuesday and Wednesday afternoon from 1:00 pm until 4:00 pm. You do not need an appointment for the interview!  °[facility] [address], [place], [place] 336-631-2330   °[place] [facility]  336-342-8273   °[place]  336-703-3100   °[facility]  336-570-6415   ° °Behavioral Health Resources in the Community: °Intensive Outpatient Programs °Organization         Address  Phone  Notes  °[facility] [address], [place], [place] 336-878-6098   °[facility] [address], [place], [place] 336-832-9800   °[facility] [address], [place], [place] ° 336-882-2125   °[facility] [address],  °[place], [place] 1-800-853-5163 or 336-641-4981   °Substance Abuse Resources °Organization         Address  Phone  Notes  °[facility]  336-882-2125   °[facility]  336-784-9470   °[facility]  336-285-9073   °[facility]  336-845-3988   °Residential & Outpatient Substance Abuse Program  1-800-659-3381   °Psychological Services °Organization         Address  Phone  Notes  ° Health  336- 832-9600   °[facility]  336- 378-7881   °[facility] [address], [place] 1-800-853-5163 or 336-641-4981   ° °Mobile Crisis Teams °Organization         Address  Phone  Notes  °[facility], Mobile Crisis Care Unit  1-877-626-1772   °Assertive °[facility] ° [address]  [place], [place] 336-834-9664   °Sharon DeEsch [address] °[place] 336-554-5454   ° °Self-Help/Support Groups °Organization         Address  Phone             Notes  °[facility]  - variety of support groups  336- 373-1402 Call for more information  °Narcotics Anonymous (NA), [facility] [address], °[place]  2 meetings at this location  ° °  Residential Treatment Programs °Organization         Address  Phone  Notes  °[facility] [address],    °[place]  1-866-801-8205   °[facility] ° [address], [place], [place] 704-293-8524   °[facility] [address], [place] 336-845-3988 Admissions: 8am-3pm M-F  °[facility] [address]    °[place], [place] 336-841-1104   °[facility] [address], [place], [place] 336-379-7146   °[facility] [address]  °[place], [place] 336-285-9073   °[facility] Intensive Outpatient [address], [place], [place] 336-852-3033   °[facility] [address]  °[place], [place] 1-877-615-2722 or 336-784-9470   °[facility] ([facility] [address] [place], [place] 336-227-7417 Accepts Medicaid  °[facility] [address]  °[place] 1-800-659-3381 Substance Abuse/Addiction Treatment  ° °[place] Behavioral Health Resources °Organization         Address  Phone  Notes  °[facility]  (888) 581-9988   °Julie Brannon, PhD [address] [place], [place]   (336) 349-5553 or (336) 951-0000   °[facility]   [address] °[place], [place] (336) 349-4454   °[facility] [address], [place], [place] (336) 342-8316 Insurance/Medicaid/sponsorship through [facility]  °[facility] [address] [place] [address]                                    [place], [place] (336) 342-8316 Therapy/tele-psych/case    °[facility] [address]  ° [place], [place] (336) 349-2233    °Dr. Arfeen  (336) 349-4544   °[facility] [place]  [employer] [place] [facility] 1) [address], [place] °2) [address], [place] °3)  [address], [place] (336) 349-3220 °(336) 342-7768 ° °(336) 342-8140   °[place] [facility] (336) 342-1394 or (336) 342-3537 (After Hours)    ° ° °

## 2013-03-20 NOTE — ED Notes (Signed)
MD at bedside at this time.

## 2013-04-10 ENCOUNTER — Emergency Department (HOSPITAL_COMMUNITY): Payer: No Typology Code available for payment source

## 2013-04-10 ENCOUNTER — Encounter (HOSPITAL_COMMUNITY): Payer: Self-pay | Admitting: Emergency Medicine

## 2013-04-10 ENCOUNTER — Emergency Department (HOSPITAL_COMMUNITY)
Admission: EM | Admit: 2013-04-10 | Discharge: 2013-04-10 | Disposition: A | Discharge status: Home / Self Care | Payer: No Typology Code available for payment source | Attending: Emergency Medicine | Admitting: Emergency Medicine

## 2013-04-10 DIAGNOSIS — Y9389 Activity, other specified: Secondary | ICD-10-CM | POA: Insufficient documentation

## 2013-04-10 DIAGNOSIS — T148XXA Other injury of unspecified body region, initial encounter: Secondary | ICD-10-CM | POA: Insufficient documentation

## 2013-04-10 DIAGNOSIS — S46909A Unspecified injury of unspecified muscle, fascia and tendon at shoulder and upper arm level, unspecified arm, initial encounter: Secondary | ICD-10-CM | POA: Insufficient documentation

## 2013-04-10 DIAGNOSIS — S0990XA Unspecified injury of head, initial encounter: Secondary | ICD-10-CM | POA: Insufficient documentation

## 2013-04-10 DIAGNOSIS — S0993XA Unspecified injury of face, initial encounter: Secondary | ICD-10-CM | POA: Insufficient documentation

## 2013-04-10 DIAGNOSIS — Z87442 Personal history of urinary calculi: Secondary | ICD-10-CM | POA: Insufficient documentation

## 2013-04-10 DIAGNOSIS — S4980XA Other specified injuries of shoulder and upper arm, unspecified arm, initial encounter: Secondary | ICD-10-CM | POA: Insufficient documentation

## 2013-04-10 DIAGNOSIS — S199XXA Unspecified injury of neck, initial encounter: Secondary | ICD-10-CM

## 2013-04-10 DIAGNOSIS — M25511 Pain in right shoulder: Secondary | ICD-10-CM

## 2013-04-10 DIAGNOSIS — Y92410 Unspecified street and highway as the place of occurrence of the external cause: Secondary | ICD-10-CM | POA: Insufficient documentation

## 2013-04-10 MED ORDER — HYDROCODONE-ACETAMINOPHEN 5-325 MG PO TABS
2.0000 | ORAL_TABLET | Freq: Once | ORAL | Status: AC
  Administered 2013-04-10: 2 via ORAL
  Filled 2013-04-10: qty 2

## 2013-04-10 MED ORDER — IBUPROFEN 600 MG PO TABS: 600.0000 mg | ORAL_TABLET | Freq: Four times a day (QID) | ORAL | Status: DC | PRN

## 2013-04-10 MED ORDER — IBUPROFEN 200 MG PO TABS
600.0000 mg | ORAL_TABLET | Freq: Once | ORAL | Status: AC
  Administered 2013-04-10: 600 mg via ORAL
  Filled 2013-04-10: qty 3
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
## 2013-07-03 ENCOUNTER — Encounter (HOSPITAL_COMMUNITY): Payer: Self-pay | Admitting: Emergency Medicine

## 2013-07-03 ENCOUNTER — Emergency Department (HOSPITAL_COMMUNITY)
Admission: EM | Admit: 2013-07-03 | Discharge: 2013-07-03 | Disposition: A | Discharge status: Home / Self Care | Attending: Emergency Medicine | Admitting: Emergency Medicine

## 2013-07-03 DIAGNOSIS — R61 Generalized hyperhidrosis: Secondary | ICD-10-CM | POA: Insufficient documentation

## 2013-07-03 DIAGNOSIS — Z792 Long term (current) use of antibiotics: Secondary | ICD-10-CM | POA: Insufficient documentation

## 2013-07-03 DIAGNOSIS — Z87442 Personal history of urinary calculi: Secondary | ICD-10-CM | POA: Insufficient documentation

## 2013-07-03 DIAGNOSIS — H109 Unspecified conjunctivitis: Secondary | ICD-10-CM | POA: Insufficient documentation

## 2013-07-03 DIAGNOSIS — J329 Chronic sinusitis, unspecified: Secondary | ICD-10-CM | POA: Insufficient documentation

## 2013-07-03 MED ORDER — TOBRAMYCIN 0.3 % OP SOLN: 1.0000 [drp] | OPHTHALMIC | Status: AC

## 2013-07-03 MED ORDER — AZITHROMYCIN 250 MG PO TABS: 250.0000 mg | ORAL_TABLET | Freq: Every day | ORAL | Status: DC

## 2013-07-03 NOTE — ED Notes (Signed)
Pt reports productive cough and nasal congestion x 2 weeks. Denies CP, SOB, fever, chills, HA. AO x4. NAD. Hx: allergies

## 2013-07-03 NOTE — ED Provider Notes (Signed)
CSN: [id number]     Arrival date & time 07/03/13  2218 History  This chart was scribed for non-physician practitioner working with Rolland Porter, MD, by Modena Jansky, ED Scribe. This patient was seen in room TR07C/TR07C and the patient's care was started at 11:04 PM  Chief Complaint  Patient presents with  . Cough  . Nasal Congestion    Patient is a 38 y.o. male presenting with cough. The history is provided by the patient. No language interpreter was used.  Cough Associated symptoms: chills and diaphoresis   Associated symptoms: no chest pain, no fever, no headaches and no shortness of breath    HPI Comments: Brady Martin is a 38 y.o. male with a hx of sinus problems who presents to the Emergency Department complaining of constant, moderate nasal congestion that started 2 weeks ago. She also reports associated cough productive of yellow sputum that started at the same time. He reports having intermittent episodes of diaphoresis and chills at night. Pt states that he is having associated fatigue. She states his sinus problems start getting worse a month ago. He reports he took Zyrtec with no relief. She denies chest pain, SOB, fever, and headaches. He states that he has no chronic medical problems.   Pt has no PCP  Past Medical History  Diagnosis Date  . Kidney stones    History reviewed. No pertinent past surgical history. No family history on file. History  Substance Use Topics  . Smoking status: Never Smoker   . Smokeless tobacco: Never Used  . Alcohol Use: No    Review of Systems  Constitutional: Positive for chills, diaphoresis and fatigue. Negative for fever.  HENT: Positive for congestion.   Respiratory: Positive for cough. Negative for shortness of breath.   Cardiovascular: Negative for chest pain.  Neurological: Negative for headaches.  All other systems reviewed and are negative.    Allergies  Wheat bran and Soy allergy  Home Medications   Prior to Admission  medications   Medication Sig Start Date End Date Taking? Authorizing Provider  azithromycin (ZITHROMAX) 250 MG tablet Take 1 tablet (250 mg total) by mouth daily. Take first 2 tablets together, then 1 every day until finished. 07/03/13   Hope Orlene Och, NP  ibuprofen (ADVIL,MOTRIN) 600 MG tablet Take 1 tablet (600 mg total) by mouth every 6 (six) hours as needed. 04/10/13   Derwood Kaplan, MD  tobramycin (TOBREX) 0.3 % ophthalmic solution Place 1 drop into both eyes every 4 (four) hours. 07/03/13 07/10/13  Hope Orlene Och, NP   BP 114/73  Pulse 88  Temp(Src) 98.2 F (36.8 C) (Oral)  Resp 18  SpO2 97% Physical Exam  Nursing note and vitals reviewed. Constitutional: He is oriented to person, place, and time. He appears well-developed and well-nourished.  HENT:  Head: Normocephalic and atraumatic.  Uvula midline, no erythema. Maxillary sinuses TTP.  Eyes: Conjunctivae and EOM are normal. Pupils are equal, round, and reactive to light.  Neck: Neck supple. No tracheal deviation present.  Cardiovascular: Normal rate, regular rhythm and normal heart sounds.   Pulmonary/Chest: Effort normal and breath sounds normal. He has no wheezes. He has no rales.  No rhonchi  Abdominal: Soft. There is no tenderness.  Genitourinary:  No cva tenderness  Musculoskeletal: Normal range of motion.  Lymphadenopathy:    He has no cervical adenopathy.  Neurological: He is alert and oriented to person, place, and time. No cranial nerve deficit.  Skin: Skin is warm and dry.  Psychiatric: He has a normal mood and affect. His behavior is normal.    ED Course  Procedures (including critical care time) DIAGNOSTIC STUDIES: Oxygen Saturation is 97% on RA, normal by my interpretation.    COORDINATION OF CARE: 11:09 PM- Pt advised of plan for treatment which includes medication and pt agrees.   MDM  38 y.o. male with sinus congestion, productive cough. Will treat for sinusitis and bronchitis. He will return for worsening  symptoms. Stable for discharge without fever, shortness of breath or respiratory distress. 02 SAT 97% on R/A.    Medication List    TAKE these medications       azithromycin 250 MG tablet  Commonly known as:  ZITHROMAX  Take 1 tablet (250 mg total) by mouth daily. Take first 2 tablets together, then 1 every day until finished.     tobramycin 0.3 % ophthalmic solution  Commonly known as:  TOBREX  Place 1 drop into both eyes every 4 (four) hours.      ASK your doctor about these medications       ibuprofen 600 MG tablet  Commonly known as:  ADVIL,MOTRIN  Take 1 tablet (600 mg total) by mouth every 6 (six) hours as needed.         Final diagnoses:  Sinusitis  Conjunctivitis   I personally performed the services described in this documentation, which was scribed in my presence. The recorded information has been reviewed and is accurate.     SalemHope M Neese, [place]NP 07/04/13 [phone]

## 2013-07-03 NOTE — Discharge Instructions (Signed)
Continue your Zyrtec and your Flonase. Take the antibiotics and use the eye drops as directed. Return for worsening symptoms.

## 2013-07-14 ENCOUNTER — Encounter (HOSPITAL_COMMUNITY): Payer: Self-pay | Admitting: Emergency Medicine

## 2013-07-14 ENCOUNTER — Emergency Department (HOSPITAL_COMMUNITY)

## 2013-07-14 ENCOUNTER — Emergency Department (HOSPITAL_COMMUNITY)
Admission: EM | Admit: 2013-07-14 | Discharge: 2013-07-15 | Disposition: A | Discharge status: Home / Self Care | Payer: Self-pay | Attending: Emergency Medicine | Admitting: Emergency Medicine

## 2013-07-14 DIAGNOSIS — S51809A Unspecified open wound of unspecified forearm, initial encounter: Secondary | ICD-10-CM | POA: Insufficient documentation

## 2013-07-14 DIAGNOSIS — S41111A Laceration without foreign body of right upper arm, initial encounter: Secondary | ICD-10-CM

## 2013-07-14 DIAGNOSIS — Y9389 Activity, other specified: Secondary | ICD-10-CM | POA: Insufficient documentation

## 2013-07-14 DIAGNOSIS — Z79899 Other long term (current) drug therapy: Secondary | ICD-10-CM | POA: Insufficient documentation

## 2013-07-14 DIAGNOSIS — W268XXA Contact with other sharp object(s), not elsewhere classified, initial encounter: Secondary | ICD-10-CM | POA: Insufficient documentation

## 2013-07-14 DIAGNOSIS — Y929 Unspecified place or not applicable: Secondary | ICD-10-CM | POA: Insufficient documentation

## 2013-07-14 DIAGNOSIS — Z87442 Personal history of urinary calculi: Secondary | ICD-10-CM | POA: Insufficient documentation

## 2013-07-14 DIAGNOSIS — S41109A Unspecified open wound of unspecified upper arm, initial encounter: Secondary | ICD-10-CM | POA: Insufficient documentation

## 2013-07-14 LAB — CBC WITH DIFFERENTIAL/PLATELET
BASOS ABS: 0 10*3/uL (ref 0.0–0.1)
BASOS PCT: 0 % (ref 0–1)
Eosinophils Absolute: 0.2 10*3/uL (ref 0.0–0.7)
Eosinophils Relative: 4 % (ref 0–5)
HCT: 36.8 % — ABNORMAL LOW (ref 39.0–52.0)
Hemoglobin: 12.7 g/dL — ABNORMAL LOW (ref 13.0–17.0)
Lymphocytes Relative: 47 % — ABNORMAL HIGH (ref 12–46)
Lymphs Abs: 3 10*3/uL (ref 0.7–4.0)
MCH: 30.8 pg (ref 26.0–34.0)
MCHC: 34.5 g/dL (ref 30.0–36.0)
MCV: 89.1 fL (ref 78.0–100.0)
Monocytes Absolute: 0.5 10*3/uL (ref 0.1–1.0)
Monocytes Relative: 7 % (ref 3–12)
NEUTROS ABS: 2.6 10*3/uL (ref 1.7–7.7)
NEUTROS PCT: 42 % — AB (ref 43–77)
PLATELETS: 169 10*3/uL (ref 150–400)
RBC: 4.13 MIL/uL — ABNORMAL LOW (ref 4.22–5.81)
RDW: 13.5 % (ref 11.5–15.5)
WBC: 6.3 10*3/uL (ref 4.0–10.5)

## 2013-07-14 LAB — COMPREHENSIVE METABOLIC PANEL
ALT: 21 U/L (ref 0–53)
AST: 22 U/L (ref 0–37)
Albumin: 3.9 g/dL (ref 3.5–5.2)
Alkaline Phosphatase: 65 U/L (ref 39–117)
BUN: 12 mg/dL (ref 6–23)
CALCIUM: 10 mg/dL (ref 8.4–10.5)
CO2: 22 meq/L (ref 19–32)
CREATININE: 0.99 mg/dL (ref 0.50–1.35)
Chloride: 100 mEq/L (ref 96–112)
GFR calc Af Amer: >90 mL/min (ref >90)
GFR calc non Af Amer: >90 mL/min (ref >90)
Glucose, Bld: 144 mg/dL — ABNORMAL HIGH (ref 70–99)
Potassium: 3.4 mEq/L — ABNORMAL LOW (ref 3.7–5.3)
Sodium: 140 mEq/L (ref 137–147)
Total Bilirubin: 0.2 mg/dL — ABNORMAL LOW (ref 0.3–1.2)
Total Protein: 8.2 g/dL (ref 6.0–8.3)

## 2013-07-14 MED ORDER — FENTANYL CITRATE 0.05 MG/ML IJ SOLN
50.0000 ug | Freq: Once | INTRAMUSCULAR | Status: AC
  Administered 2013-07-14: 50 ug via INTRAVENOUS
  Filled 2013-07-14: qty 2

## 2013-07-14 MED ORDER — OXYCODONE-ACETAMINOPHEN 5-325 MG PO TABS: 1.0000 | ORAL_TABLET | Freq: Once | ORAL | Status: DC

## 2013-07-14 MED ORDER — HYDROMORPHONE HCL PF 1 MG/ML IJ SOLN
1.0000 mg | Freq: Once | INTRAMUSCULAR | Status: AC
  Administered 2013-07-15: 1 mg via INTRAVENOUS
  Filled 2013-07-14: qty 1

## 2013-07-14 NOTE — ED Provider Notes (Signed)
Medical screening examination/treatment/procedure(s) were performed by non-physician practitioner and as supervising physician I was immediately available for consultation/collaboration.   EKG Interpretation None        Rolland PorterMark Keenen Roessner, MD 07/14/13 [phone]

## 2013-07-14 NOTE — ED Notes (Signed)
Dr. McLennan at bedside 

## 2013-07-14 NOTE — ED Notes (Signed)
Pt continues to mess with bandage, pulling and twisting at the bandage. Bleeding seen through the dressing. Pt told to let the bandage set to form pressure over laceration. Pt family at bedside to help with explanation.

## 2013-07-14 NOTE — ED Notes (Signed)
Dr. Roxy CedarMcLennan at bedside with suture cart.

## 2013-07-14 NOTE — ED Provider Notes (Signed)
CSN: [id number]     Arrival date & time 07/14/13  2044 History   First MD Initiated Contact with Patient 07/14/13 2202     Chief Complaint  Patient presents with  . Extremity Laceration     (Consider location/radiation/quality/duration/timing/severity/associated sxs/prior Treatment) Patient is a 38 y.o. male presenting with arm injury. The history is provided by the patient.  Arm Injury Location:  Arm Arm location:  R forearm and R upper arm Pain details:    Quality:  Aching   Radiates to:  Does not radiate   Severity:  Severe   Onset quality:  Sudden   Timing:  Constant Chronicity:  New Foreign body present:  Unable to specify Tetanus status:  Up to date Prior injury to area:  No Relieved by:  Nothing Worsened by:  Movement Associated symptoms: no fever     38 yo male pw arm lacerations. Occurred just PTA. Pushing door open. Was stuck and required extra force. Arm went through glass. Laceration x1 to right forearm. Laceration x3 to right proximal arm. No numbness/tingling/weakness.  tdap <1 year ago.  No other injuries.  Bleeding controlled.  Past Medical History  Diagnosis Date  . Kidney stones    History reviewed. No pertinent past surgical history. History reviewed. No pertinent family history. History  Substance Use Topics  . Smoking status: Never Smoker   . Smokeless tobacco: Never Used  . Alcohol Use: No    Review of Systems  Constitutional: Negative for fever and chills.  Cardiovascular: Negative for chest pain.  Gastrointestinal: Negative for nausea, vomiting and abdominal pain.  Skin: Positive for wound.  Neurological: Negative for weakness and numbness.  All other systems reviewed and are negative.     Allergies  Wheat bran; Eggs or egg-derived products; Milk-related compounds; and Soy allergy  Home Medications   Prior to Admission medications   Medication Sig Start Date End Date Taking? Authorizing Provider  cetirizine (ZYRTEC) 10 MG tablet  Take 10 mg by mouth daily.   Yes Historical Provider, MD  fluticasone (FLONASE) 50 MCG/ACT nasal spray Place 1 spray into both nostrils daily as needed for allergies or rhinitis.   Yes Historical Provider, MD  tobramycin (TOBREX) 0.3 % ophthalmic solution Place 1 drop into both eyes every 4 (four) hours as needed (irritation).   Yes Historical Provider, MD   BP 138/89  Pulse 64  Temp(Src) 97.8 F (36.6 C) (Oral)  Resp 22  SpO2 99% Physical Exam  Nursing note and vitals reviewed. Constitutional: He is oriented to person, place, and time. He appears well-developed and well-nourished. No distress.  HENT:  Head: Normocephalic and atraumatic.  Eyes: Conjunctivae are normal. Right eye exhibits no discharge. Left eye exhibits no discharge.  Neck: No tracheal deviation present.  Cardiovascular: Normal heart sounds and intact distal pulses.   Pulmonary/Chest: Effort normal. No stridor. No respiratory distress.  Musculoskeletal: He exhibits tenderness. He exhibits no edema.  Laceration to dorsal aspect right forearm. Superficial. Hemostatic. No deep structures visualized.  Laceration x2 to proximal arm. Hemostatic. Able to visualize deep muscle tissue in them. Do not visualize tendon/ligament injury in full ROM.  Motor and sensation intact distally.  Neurological: He is alert and oriented to person, place, and time.  Skin: Skin is warm and dry.  Psychiatric: He has a normal mood and affect. His behavior is normal.    ED Course  LACERATION REPAIR Date/Time: 07/15/2013 1:36 AM Performed by: Stevie KernMCLENNAN, RYAN Authorized by: Billee CashingPICKERING, NATHAN R. Consent: Verbal consent obtained.  Body area: upper extremity Location details: right upper arm Wound length (cm): ~14cm jagged laceration. Foreign bodies: no foreign bodies Tendon involvement: none Nerve involvement: none Vascular damage: no Anesthesia: local infiltration Local anesthetic: lidocaine 2% with epinephrine Anesthetic total (ml):  10. Preparation: Patient was prepped and draped in the usual sterile fashion. Irrigation solution: saline Irrigation method: syringe Amount of cleaning: extensive Debridement: minimal Skin closure: 4-0 Prolene Subcutaneous closure: 4-0 Vicryl Number of sutures: 4 deep. 23 superficial. Technique: simple Approximation: close Approximation difficulty: complex Dressing: 4x4 sterile gauze and antibiotic ointment Patient tolerance: Patient tolerated the procedure well with no immediate complications.  LACERATION REPAIR Date/Time: 07/15/2013 1:39 AM Performed by: Stevie Kern Authorized by: Billee Cashing Consent: Verbal consent obtained. Body area: upper extremity Location details: right upper arm Wound length (cm): 4. Foreign bodies: no foreign bodies Tendon involvement: none Nerve involvement: none Vascular damage: no Anesthesia: local infiltration Local anesthetic: lidocaine 1% with epinephrine Anesthetic total (ml): 3. Preparation: Patient was prepped and draped in the usual sterile fashion. Irrigation solution: saline Irrigation method: syringe Amount of cleaning: extensive Debridement: minimal Degree of undermining: minimal Skin closure: 5-0 Prolene Number of sutures: 4. Technique: simple Approximation: close Approximation difficulty: complex Dressing: 4x4 sterile gauze and antibiotic ointment Patient tolerance: Patient tolerated the procedure well with no immediate complications.  LACERATION REPAIR Date/Time: 07/15/2013 1:40 AM Performed by: Stevie Kern Authorized by: Billee Cashing Consent: Verbal consent obtained. Body area: upper extremity Location details: right lower arm Wound length (cm): 6. Foreign bodies: no foreign bodies Tendon involvement: none Nerve involvement: none Vascular damage: no Anesthesia: local infiltration Local anesthetic: lidocaine 2% with epinephrine Anesthetic total (ml): 5. Preparation: Patient was prepped and  draped in the usual sterile fashion. Irrigation solution: saline Irrigation method: syringe Amount of cleaning: extensive Debridement: none Degree of undermining: none Skin closure: 4-0 Prolene Number of sutures: 8. Technique: simple Approximation: close Approximation difficulty: simple Dressing: 4x4 sterile gauze and antibiotic ointment Patient tolerance: Patient tolerated the procedure well with no immediate complications.   (including critical care time) Labs Review Labs Reviewed  CBC WITH DIFFERENTIAL - Abnormal; Notable for the following:    RBC 4.13 (*)    Hemoglobin 12.7 (*)    HCT 36.8 (*)    Neutrophils Relative % 42 (*)    Lymphocytes Relative 47 (*)    All other components within normal limits  COMPREHENSIVE METABOLIC PANEL - Abnormal; Notable for the following:    Potassium 3.4 (*)    Glucose, Bld 144 (*)    Total Bilirubin 0.2 (*)    All other components within normal limits    Imaging Review Dg Forearm Right  07/14/2013   CLINICAL DATA:  Laceration to the anterior right forearm.  EXAM: RIGHT FOREARM - 2 VIEW  COMPARISON:  None.  FINDINGS: No radiopaque foreign body or fracture. Bandage is present over the volar aspect of the forearm. Radius and ulna intact.  IMPRESSION: Negative.   Electronically Signed   By: Andreas Newport M.D.   On: 07/14/2013 23:22   Dg Humerus Right  07/14/2013   CLINICAL DATA:  Laceration to the arm.  EXAM: RIGHT HUMERUS - 2+ VIEW  COMPARISON:  None.  FINDINGS: Soft tissue irregularity is present over the anterior aspect of the distal upper arm, suggesting site of laceration. Humerus is intact.  IMPRESSION: No osseous injury.   Electronically Signed   By: Andreas Newport M.D.   On: 07/14/2013 23:23     EKG Interpretation None  MDM   Final diagnoses:  Lacerations of multiple sites of right arm, initial encounter    Laceration x3. Tetanus UTD Imaging without FB Lac repair as above.  Patient discharged home. Return  precautions given. To follow up in 10 days for suture removal. patient in agreement with plan.  Labs and imaging reviewed by myself and considered in medical decision making if ordered. Imaging interpreted by radiology.   Discussed case with Dr. Rubin PayorPickering who is in agreement with assessment and plan.      Stevie Kernyan McLennan, MD 07/15/13 Earle [id number]
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
## 2013-07-15 NOTE — ED Notes (Signed)
Pt A&OX4, wheeled out of ED via wheelchair, NAD. 

## 2013-07-15 NOTE — Discharge Instructions (Signed)
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
## 2013-07-20 ENCOUNTER — Emergency Department (HOSPITAL_COMMUNITY)
Admission: EM | Admit: 2013-07-20 | Discharge: 2013-07-20 | Disposition: A | Discharge status: Home / Self Care | Attending: Emergency Medicine | Admitting: Emergency Medicine

## 2013-07-20 ENCOUNTER — Encounter (HOSPITAL_COMMUNITY): Payer: Self-pay | Admitting: Emergency Medicine

## 2013-07-20 DIAGNOSIS — Z79899 Other long term (current) drug therapy: Secondary | ICD-10-CM | POA: Insufficient documentation

## 2013-07-20 DIAGNOSIS — R112 Nausea with vomiting, unspecified: Secondary | ICD-10-CM | POA: Insufficient documentation

## 2013-07-20 DIAGNOSIS — R5383 Other fatigue: Secondary | ICD-10-CM

## 2013-07-20 DIAGNOSIS — R5381 Other malaise: Secondary | ICD-10-CM | POA: Insufficient documentation

## 2013-07-20 DIAGNOSIS — Z4801 Encounter for change or removal of surgical wound dressing: Secondary | ICD-10-CM | POA: Insufficient documentation

## 2013-07-20 DIAGNOSIS — Z5189 Encounter for other specified aftercare: Secondary | ICD-10-CM

## 2013-07-20 DIAGNOSIS — M79609 Pain in unspecified limb: Secondary | ICD-10-CM | POA: Insufficient documentation

## 2013-07-20 DIAGNOSIS — Z792 Long term (current) use of antibiotics: Secondary | ICD-10-CM | POA: Insufficient documentation

## 2013-07-20 DIAGNOSIS — Z87442 Personal history of urinary calculi: Secondary | ICD-10-CM | POA: Insufficient documentation

## 2013-07-20 DIAGNOSIS — D649 Anemia, unspecified: Secondary | ICD-10-CM | POA: Insufficient documentation

## 2013-07-20 DIAGNOSIS — IMO0002 Reserved for concepts with insufficient information to code with codable children: Secondary | ICD-10-CM | POA: Insufficient documentation

## 2013-07-20 DIAGNOSIS — IMO0001 Reserved for inherently not codable concepts without codable children: Secondary | ICD-10-CM | POA: Insufficient documentation

## 2013-07-20 LAB — CBC WITH DIFFERENTIAL/PLATELET
Basophils Absolute: 0 10*3/uL (ref 0.0–0.1)
Basophils Relative: 0 % (ref 0–1)
Eosinophils Absolute: 0.1 10*3/uL (ref 0.0–0.7)
Eosinophils Relative: 1 % (ref 0–5)
HCT: 31.1 % — ABNORMAL LOW (ref 39.0–52.0)
HEMOGLOBIN: 10.4 g/dL — AB (ref 13.0–17.0)
LYMPHS ABS: 0.9 10*3/uL (ref 0.7–4.0)
LYMPHS PCT: 13 % (ref 12–46)
MCH: 30.4 pg (ref 26.0–34.0)
MCHC: 33.4 g/dL (ref 30.0–36.0)
MCV: 90.9 fL (ref 78.0–100.0)
MONOS PCT: 5 % (ref 3–12)
Monocytes Absolute: 0.3 10*3/uL (ref 0.1–1.0)
NEUTROS ABS: 5.4 10*3/uL (ref 1.7–7.7)
Neutrophils Relative %: 81 % — ABNORMAL HIGH (ref 43–77)
Platelets: 210 10*3/uL (ref 150–400)
RBC: 3.42 MIL/uL — AB (ref 4.22–5.81)
RDW: 13.5 % (ref 11.5–15.5)
WBC: 6.7 10*3/uL (ref 4.0–10.5)

## 2013-07-20 LAB — COMPREHENSIVE METABOLIC PANEL
ALK PHOS: 64 U/L (ref 39–117)
ALT: 14 U/L (ref 0–53)
AST: 23 U/L (ref 0–37)
Albumin: 3.9 g/dL (ref 3.5–5.2)
BUN: 7 mg/dL (ref 6–23)
CO2: 27 mEq/L (ref 19–32)
Calcium: 9.9 mg/dL (ref 8.4–10.5)
Chloride: 104 mEq/L (ref 96–112)
Creatinine, Ser: 0.95 mg/dL (ref 0.50–1.35)
GFR calc non Af Amer: >90 mL/min (ref >90)
GLUCOSE: 103 mg/dL — AB (ref 70–99)
POTASSIUM: 3.8 meq/L (ref 3.7–5.3)
SODIUM: 144 meq/L (ref 137–147)
Total Protein: 8.3 g/dL (ref 6.0–8.3)

## 2013-07-20 MED ORDER — OXYCODONE-ACETAMINOPHEN 5-325 MG PO TABS
2.0000 | ORAL_TABLET | Freq: Once | ORAL | Status: AC
  Administered 2013-07-20: 2 via ORAL
  Filled 2013-07-20: qty 2

## 2013-07-20 MED ORDER — SULFAMETHOXAZOLE-TRIMETHOPRIM 800-160 MG PO TABS: 1.0000 | ORAL_TABLET | Freq: Two times a day (BID) | ORAL | Status: DC

## 2013-07-20 MED ORDER — DOXYCYCLINE HYCLATE 100 MG PO CAPS: 100.0000 mg | ORAL_CAPSULE | Freq: Two times a day (BID) | ORAL | Status: DC

## 2013-07-20 MED ORDER — DOXYCYCLINE HYCLATE 100 MG PO TABS
100.0000 mg | ORAL_TABLET | Freq: Once | ORAL | Status: DC
  Filled 2013-07-20: qty 1

## 2013-07-20 MED ORDER — SULFAMETHOXAZOLE-TMP DS 800-160 MG PO TABS
1.0000 | ORAL_TABLET | Freq: Once | ORAL | Status: AC
  Administered 2013-07-20: 1 via ORAL
  Filled 2013-07-20: qty 1

## 2013-07-20 NOTE — ED Notes (Signed)
Pt c/o drainage and pain to closed wound. Small amount clear drainage noted.

## 2013-07-20 NOTE — ED Provider Notes (Signed)
Medical screening examination/treatment/procedure(s) were conducted as a shared visit with non-physician practitioner(s) and myself.  I personally evaluated the patient during the encounter.   EKG Interpretation None       Patient seen by me. Patient seen on June 18 for lacerations to his right forearm and the proximal arm patient had put his arm through glass. They were sutured. The forearm wound shows no evidence of any secondary infection is very normal. Patient states that there was a purulent discharge from there. Patient's proximal arm wound in the central area of the lacerations does have some puffiness older redness and tenderness. Patient also stated there was some purulent discharge from there. No significant cellulitis no significant arm swelling. No purulent discharge at this time. We'll going treat with oral antibiotic doxycycline we'll give the first dose here. And then continue that. Would recommend patient not have his sutures removed at this point until June 28. Patient distally has a good radial pulse good movement of his fingers.    Vanetta MuldersScott Lylith Bebeau, MD 07/20/13 [phone]

## 2013-07-20 NOTE — ED Notes (Signed)
NAD noted. Pt given pain med and antibiotic at discharge. Discharge instructions reviewed. All questions identified and answered clearly. Pt discharged home with family. Pt ambulatory on discharge.

## 2013-07-20 NOTE — ED Notes (Signed)
Pt presents to department for evaluation of wound check. States he had sutures placed to R arm several days ago. Now states increased pain and yellow/red drainage noted from suture sites. Pt states he feels very weak, has fever/chills at home, also states nausea. Pt is alert and oriented x4. 8/10 pain to R arm sutures.

## 2013-07-20 NOTE — Discharge Instructions (Signed)
Apply antibiotic ointment to the wound  Keep clean and dry  Wash daily with soap and water  Take antibiotics for full dose even if your symptoms are improving  Return to the emergency department if you develop any changing/worsening condition, fever, spreading redness/swelling, drainage, or any other concerns (please read additional information regarding your condition below)    Wound Care Wound care helps prevent pain and infection.  You may need a tetanus shot if:  You cannot remember when you had your last tetanus shot.  You have never had a tetanus shot.  The injury broke your skin. If you need a tetanus shot and you choose not to have one, you may get tetanus. Sickness from tetanus can be serious. HOME CARE   Only take medicine as told by your doctor.  Clean the wound daily with mild soap and water.  Change any bandages (dressings) as told by your doctor.  Put medicated cream and a bandage on the wound as told by your doctor.  Change the bandage if it gets wet, dirty, or starts to smell.  Take showers. Do not take baths, swim, or do anything that puts your wound under water.  Rest and raise (elevate) the wound until the pain and puffiness (swelling) are better.  Keep all doctor visits as told. GET HELP RIGHT AWAY IF:   Yellowish-white fluid (pus) comes from the wound.  Medicine does not lessen your pain.  There is a red streak going away from the wound.  You have a fever. MAKE SURE YOU:   Understand these instructions.  Will watch your condition.  Will get help right away if you are not doing well or get worse. Document Released: 10/23/2007 Document Revised: 04/07/2011 Document Reviewed: 05/19/2010 [facility] Patient Information 2015 [employer], [place]. This information is not intended to replace advice given to you by your health care provider. Make sure you discuss any questions you have with your health care provider.

## 2013-07-20 NOTE — ED Provider Notes (Signed)
CSN: [id number]     Arrival date & time 07/20/13  1532 History   First MD Initiated Contact with Patient 07/20/13 1912     Chief Complaint  Patient presents with  . Wound Check   HPI  Brady Martin is a 38 y.o. male with a PMH of kidney stones who presents to the ED for evaluation of wound check. History was provided by the patient. Patient had sutures to the right arm placed on 07/14/13. Patient complains of an increasing gradually worsening constant pain to the suture sites. He also has had yellow clear and purulent drainage. He has had generalized weakness and fatigue but no fever or chills. He also has had nausea with one episode of emesis PTA. No abdominal pain. He denies any weakness, numbness, or loss of sensation. No other concerns including cough, headache, dizziness or lightheadedness. No rectal bleeding.    Past Medical History  Diagnosis Date  . Kidney stones    History reviewed. No pertinent past surgical history. History reviewed. No pertinent family history. History  Substance Use Topics  . Smoking status: Never Smoker   . Smokeless tobacco: Never Used  . Alcohol Use: No    Review of Systems  Constitutional: Positive for fatigue. Negative for fever, chills, diaphoresis, activity change and appetite change.  Respiratory: Negative for cough and shortness of breath.   Gastrointestinal: Positive for nausea and vomiting. Negative for abdominal pain, diarrhea and constipation.  Musculoskeletal: Positive for myalgias (right arm). Negative for arthralgias and back pain.  Skin: Positive for wound. Negative for color change.  Neurological: Positive for weakness (generalized). Negative for dizziness, light-headedness and headaches.    Allergies  Wheat bran; Eggs or egg-derived products; Milk-related compounds; and Soy allergy  Home Medications   Prior to Admission medications   Medication Sig Start Date End Date Taking? Authorizing Provider  cetirizine (ZYRTEC) 10 MG  tablet Take 10 mg by mouth daily.    Historical Provider, MD  fluticasone (FLONASE) 50 MCG/ACT nasal spray Place 1 spray into both nostrils daily as needed for allergies or rhinitis.    Historical Provider, MD  tobramycin (TOBREX) 0.3 % ophthalmic solution Place 1 drop into both eyes every 4 (four) hours as needed (irritation).    Historical Provider, MD   BP 126/81  Pulse 115  Temp(Src) 99.7 F (37.6 C) (Oral)  Resp 18  SpO2 98%  Filed Vitals:   07/20/13 1900 07/20/13 1915 07/20/13 1926 07/20/13 1930  BP: 118/72 110/66 110/66 119/67  Pulse: 89 90 100 85  Temp:   98.6 F (37 C) 98.7 F (37.1 C)  TempSrc:   Oral Oral  Resp:   16   SpO2: 100% 99% 100% 100%    Physical Exam  Nursing note and vitals reviewed. Constitutional: He is oriented to person, place, and time. He appears well-developed and well-nourished. No distress.  Non-toxic  HENT:  Head: Normocephalic and atraumatic.  Right Ear: External ear normal.  Left Ear: External ear normal.  Mouth/Throat: Oropharynx is clear and moist.  Eyes: Conjunctivae are normal. Right eye exhibits no discharge. Left eye exhibits no discharge.  Neck: Normal range of motion. Neck supple.  Cardiovascular: Normal rate, regular rhythm, normal heart sounds and intact distal pulses.  Exam reveals no gallop and no friction rub.   No murmur heard. Radial pulses present and equal bilaterally  Pulmonary/Chest: Effort normal and breath sounds normal. No respiratory distress. He has no wheezes. He has no rales. He exhibits no tenderness.  Abdominal:  Soft. He exhibits no distension. There is no tenderness.  Musculoskeletal: Normal range of motion. He exhibits tenderness. He exhibits no edema.       Arms: ROM intact in the UE bilaterally. No joint effusion, edema, or erythema. Compartments soft.   Neurological: He is alert and oriented to person, place, and time.  Sensation intact in the UE bilaterally   Skin: Skin is warm and dry. He is not  diaphoretic.  Sutures in place with no drainage or wound dehiscence. No warmth, erythema or surrounding edema. No right arm swelling.      ED Course  Procedures (including critical care time) Labs Review Labs Reviewed  CBC WITH DIFFERENTIAL - Abnormal; Notable for the following:    RBC 3.42 (*)    Hemoglobin 10.4 (*)    HCT 31.1 (*)    Neutrophils Relative % 81 (*)    All other components within normal limits  COMPREHENSIVE METABOLIC PANEL - Abnormal; Notable for the following:    Glucose, Bld 103 (*)    Total Bilirubin <0.2 (*)    All other components within normal limits    Imaging Review No results found.   EKG Interpretation None      Results for orders placed during the hospital encounter of 07/20/13  CBC WITH DIFFERENTIAL      Result Value Ref Range   WBC 6.7  4.0 - 10.5 K/uL   RBC 3.42 (*) 4.22 - 5.81 MIL/uL   Hemoglobin 10.4 (*) 13.0 - 17.0 g/dL   HCT 16.131.1 (*) 09.639.0 - 04.552.0 %   MCV 90.9  78.0 - 100.0 fL   MCH 30.4  26.0 - 34.0 pg   MCHC 33.4  30.0 - 36.0 g/dL   RDW 40.913.5  81.111.5 - 91.415.5 %   Platelets 210  150 - 400 K/uL   Neutrophils Relative % 81 (*) 43 - 77 %   Neutro Abs 5.4  1.7 - 7.7 K/uL   Lymphocytes Relative 13  12 - 46 %   Lymphs Abs 0.9  0.7 - 4.0 K/uL   Monocytes Relative 5  3 - 12 %   Monocytes Absolute 0.3  0.1 - 1.0 K/uL   Eosinophils Relative 1  0 - 5 %   Eosinophils Absolute 0.1  0.0 - 0.7 K/uL   Basophils Relative 0  0 - 1 %   Basophils Absolute 0.0  0.0 - 0.1 K/uL  COMPREHENSIVE METABOLIC PANEL      Result Value Ref Range   Sodium 144  137 - 147 mEq/L   Potassium 3.8  3.7 - 5.3 mEq/L   Chloride 104  96 - 112 mEq/L   CO2 27  19 - 32 mEq/L   Glucose, Bld 103 (*) 70 - 99 mg/dL   BUN 7  6 - 23 mg/dL   Creatinine, Ser 7.820.95  0.50 - 1.35 mg/dL   Calcium 9.9  8.4 - 95.610.5 mg/dL   Total Protein 8.3  6.0 - 8.3 g/dL   Albumin 3.9  3.5 - 5.2 g/dL   AST 23  0 - 37 U/L   ALT 14  0 - 53 U/L   Alkaline Phosphatase 64  39 - 117 U/L   Total Bilirubin  <0.2 (*) 0.3 - 1.2 mg/dL   GFR calc non Af Amer >90  >90 mL/min   GFR calc Af Amer >90  >90 mL/min     MDM   Brady Martin is a 38 y.o. male with a PMH of kidney  stones who presents to the ED for evaluation of wound check. No obvious cellulitis or other infectious process at this time, however, patient has increased pain from suture sites. Will treat with Bactrim. First dose given in the ED. No leukocytosis (6.7). Patient afebrile and non-toxic in appearance. Vital signs stable. Labs revealed anemia (10.4 and 31.1) which is decreased from his previous. This may be due to blood loss from his previous injury. Wound care discussed with patient. Return for suture removal in 2-4 days. Return precautions, discharge instructions, and follow-up was discussed with the patient before discharge.     Discharge Medication List as of 07/20/2013  7:44 PM    START taking these medications   Details  sulfamethoxazole-trimethoprim (SEPTRA DS) 800-160 MG per tablet Take 1 tablet by mouth every 12 (twelve) hours., Starting 07/20/2013, Until Discontinued, Print        Final impressions: 1. Encounter for wound re-check   2. Pain in wound      Luiz IronJessica Katlin Palmer PA-C   This patient was discussed with Dr. Deretha EmoryZackowski who evaluated the patient         Jillyn LedgerJessica K Palmer, PA-C 07/21/13 1209

## 2013-07-25 ENCOUNTER — Encounter (HOSPITAL_COMMUNITY): Payer: Self-pay | Admitting: Emergency Medicine

## 2013-07-25 ENCOUNTER — Emergency Department (HOSPITAL_COMMUNITY)
Admission: EM | Admit: 2013-07-25 | Discharge: 2013-07-25 | Disposition: A | Discharge status: Home / Self Care | Attending: Emergency Medicine | Admitting: Emergency Medicine

## 2013-07-25 DIAGNOSIS — Z87442 Personal history of urinary calculi: Secondary | ICD-10-CM | POA: Insufficient documentation

## 2013-07-25 DIAGNOSIS — Z4802 Encounter for removal of sutures: Secondary | ICD-10-CM

## 2013-07-25 NOTE — ED Notes (Signed)
Has had sutures in place for 11 days.

## 2013-07-25 NOTE — ED Provider Notes (Signed)
CSN: [id number]     Arrival date & time 07/25/13  [id number] History   This chart was scribed for a non-physician practitioner, Santiago GladHeather Laisure PA-C, working with Merrie RoofJohn David Wofford III, * by Julian HyMorgan Graham, ED Scribe. The patient was seen in [facility] LPR06C/TR06C. The patient's care was started at 9:13 AM.   Chief Complaint  Patient presents with  . Suture / Staple Removal   The history is provided by the patient. No language interpreter was used.   HPI Comments: Brady Martin is a 38 y.o. male who presents to the Emergency Department for removal of sutures in the right arm that were placed 11 days ago. Patient was seen on 6/18 when he had the sutures placed and again on 6/24 for a wound check. Patient reports having worsening pain and purulent discharge at last visit and was prescribed Bactrim. He reports he has been compliant with this medication and states the drainage has subsided. Patient states associated itching and intermittent pain. He denies drainage, fever, chills, nausea, vomiting, tingling sensations to the fingers, numbness, swelling in arm.   Past Medical History  Diagnosis Date  . Kidney stones    Past Surgical History  Procedure Laterality Date  . No past surgeries     No family history on file. History  Substance Use Topics  . Smoking status: Never Smoker   . Smokeless tobacco: Never Used  . Alcohol Use: No    Review of Systems  Constitutional: Negative for fever and chills.  Gastrointestinal: Negative for nausea and vomiting.  Musculoskeletal: Negative for joint swelling.  Skin: Positive for wound (laceration ).  Neurological: Negative for numbness.  All other systems reviewed and are negative.     Allergies  Wheat bran; Eggs or egg-derived products; Milk-related compounds; and Soy allergy  Home Medications   Prior to Admission medications   Medication Sig Start Date End Date Taking? Authorizing Saranya Harlin  sulfamethoxazole-trimethoprim (SEPTRA DS) 800-160 MG per  tablet Take 1 tablet by mouth every 12 (twelve) hours. 07/20/13   Jillyn LedgerJessica K Palmer, PA-C   Triage Vitals: BP 139/76  Pulse 82  Temp(Src) 98.1 F (36.7 C) (Oral)  Resp 18  SpO2 96% Physical Exam  Nursing note and vitals reviewed. Constitutional: He is oriented to person, place, and time. He appears well-developed and well-nourished. No distress.  HENT:  Head: Normocephalic and atraumatic.  Eyes: Conjunctivae and EOM are normal.  Neck: Neck supple. No tracheal deviation present.  Cardiovascular: Normal rate, regular rhythm and normal heart sounds.   2+ radial pulses to right hand.   Pulmonary/Chest: Effort normal. No respiratory distress.  Musculoskeletal: Normal range of motion.  Sutures intact to the RUE. No erythema, edema, warmth, or drainage. No swelling of the right arm. Compartments are soft.   Neurological: He is alert and oriented to person, place, and time.  Distal sensation of fingers of right hand are intact.   Skin: Skin is warm and dry.  Psychiatric: He has a normal mood and affect. His behavior is normal.    ED Course  Procedures (including critical care time) DIAGNOSTIC STUDIES: Oxygen Saturation is 96% on room air, adequate by my interpretation.    COORDINATION OF CARE: 9:17 AM- Pt will have sutures removed. Patient informed of current plan for treatment and evaluation and agrees with plan at this time.  9:53 AM SUTURE REMOVAL Performed by: Santiago GladHeather Laisure, PA-C Authorized by: Santiago GladHeather Laisure, PA-C Consent: Verbal consent obtained. Consent given by: patient Required items: required blood products, implants, devices,  and special equipment available  Time out: Immediately prior to procedure a "time out" was called to verify the correct patient, procedure, equipment, support staff and site/side marked as required. Location: right arm  Wound Appearance: clean Sutures Removed: 35 Post-removal: N/A Patient tolerance: Patient tolerated the procedure well with no  immediate complications.      MDM   Final diagnoses:  None   Patient presents today for suture removal.  No signs of infection at this time.  Patient neurovascularly intact.  Sutures removed.  Patient stable for discharge.  Return precautions given.  I personally performed the services described in this documentation, which was scribed in my presence. The recorded information has been reviewed and is accurate.   Santiago GladHeather Laisure, PA-C 07/25/13 1049

## 2013-07-25 NOTE — Discharge Instructions (Signed)

## 2013-07-27 NOTE — ED Provider Notes (Signed)
Medical screening examination/treatment/procedure(s) were performed by non-physician practitioner and as supervising physician I was immediately available for consultation/collaboration.   Candyce ChurnJohn David Wofford III, MD 07/27/13 1046

## 2015-01-09 ENCOUNTER — Emergency Department (HOSPITAL_COMMUNITY): Payer: No Typology Code available for payment source

## 2015-01-09 ENCOUNTER — Encounter (HOSPITAL_COMMUNITY): Payer: Self-pay | Admitting: Family Medicine

## 2015-01-09 ENCOUNTER — Emergency Department (HOSPITAL_COMMUNITY)
Admission: EM | Admit: 2015-01-09 | Discharge: 2015-01-09 | Disposition: A | Discharge status: Home / Self Care | Payer: No Typology Code available for payment source | Attending: Emergency Medicine | Admitting: Emergency Medicine

## 2015-01-09 DIAGNOSIS — Y998 Other external cause status: Secondary | ICD-10-CM | POA: Insufficient documentation

## 2015-01-09 DIAGNOSIS — S199XXA Unspecified injury of neck, initial encounter: Secondary | ICD-10-CM | POA: Diagnosis present

## 2015-01-09 DIAGNOSIS — Y92410 Unspecified street and highway as the place of occurrence of the external cause: Secondary | ICD-10-CM | POA: Diagnosis not present

## 2015-01-09 DIAGNOSIS — S4991XA Unspecified injury of right shoulder and upper arm, initial encounter: Secondary | ICD-10-CM

## 2015-01-09 DIAGNOSIS — Z87442 Personal history of urinary calculi: Secondary | ICD-10-CM | POA: Insufficient documentation

## 2015-01-09 DIAGNOSIS — S0990XA Unspecified injury of head, initial encounter: Secondary | ICD-10-CM | POA: Diagnosis not present

## 2015-01-09 DIAGNOSIS — Y9389 Activity, other specified: Secondary | ICD-10-CM | POA: Insufficient documentation

## 2015-01-09 DIAGNOSIS — M47812 Spondylosis without myelopathy or radiculopathy, cervical region: Secondary | ICD-10-CM | POA: Insufficient documentation

## 2015-01-09 MED ORDER — HYDROCODONE-ACETAMINOPHEN 5-325 MG PO TABS: 1.0000 | ORAL_TABLET | ORAL | Status: DC | PRN
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
## 2015-01-09 NOTE — Discharge Instructions (Signed)
Use ibuprofen or Tylenol, for pain. Use ice for 2 days, then heat on the sore areas. [place] primary care doctor if not better in 1 or 2 weeks.    Cervical Sprain A cervical sprain is an injury in the neck in which the strong, fibrous tissues (ligaments) that connect your neck bones stretch or tear. Cervical sprains can range from mild to severe. Severe cervical sprains can cause the neck vertebrae to be unstable. This can lead to damage of the spinal cord and can result in serious nervous system problems. The amount of time it takes for a cervical sprain to get better depends on the cause and extent of the injury. Most cervical sprains heal in 1 to 3 weeks. CAUSES  Severe cervical sprains may be caused by:   Contact sport injuries (such as from football, rugby, wrestling, hockey, auto racing, gymnastics, diving, martial arts, or boxing).   Motor vehicle collisions.   Whiplash injuries. This is an injury from a sudden forward and backward whipping movement of the head and neck.  Falls.  Mild cervical sprains may be caused by:   Being in an awkward position, such as while cradling a telephone between your ear and shoulder.   Sitting in a chair that does not offer proper support.   Working at a poorly Marketing executive station.   Looking up or down for long periods of time.  SYMPTOMS   Pain, soreness, stiffness, or a burning sensation in the front, back, or sides of the neck. This discomfort may develop immediately after the injury or slowly, 24 hours or more after the injury.   Pain or tenderness directly in the middle of the back of the neck.   Shoulder or upper back pain.   Limited ability to move the neck.   Headache.   Dizziness.   Weakness, numbness, or tingling in the hands or arms.   Muscle spasms.   Difficulty swallowing or chewing.   Tenderness and swelling of the neck.  DIAGNOSIS  Most of the time your health care provider can diagnose a cervical  sprain by taking your history and doing a physical exam. Your health care provider will ask about previous neck injuries and any known neck problems, such as arthritis in the neck. X-rays may be taken to find out if there are any other problems, such as with the bones of the neck. Other tests, such as a CT scan or MRI, may also be needed.  TREATMENT  Treatment depends on the severity of the cervical sprain. Mild sprains can be treated with rest, keeping the neck in place (immobilization), and pain medicines. Severe cervical sprains are immediately immobilized. Further treatment is done to help with pain, muscle spasms, and other symptoms and may include:  Medicines, such as pain relievers, numbing medicines, or muscle relaxants.   Physical therapy. This may involve stretching exercises, strengthening exercises, and posture training. Exercises and improved posture can help stabilize the neck, strengthen muscles, and help stop symptoms from returning.  HOME CARE INSTRUCTIONS   Put ice on the injured area.   Put ice in a plastic bag.   Place a towel between your skin and the bag.   Leave the ice on for 15-20 minutes, 3-4 times a day.   If your injury was severe, you may have been given a cervical collar to wear. A cervical collar is a two-piece collar designed to keep your neck from moving while it heals.  Do not remove the collar unless  instructed by your health care provider.  If you have long hair, keep it outside of the collar.  Ask your health care provider before making any adjustments to your collar. Minor adjustments may be required over time to improve comfort and reduce pressure on your chin or on the back of your head.  Ifyou are allowed to remove the collar for cleaning or bathing, follow your health care provider's instructions on how to do so safely.  Keep your collar clean by wiping it with mild soap and water and drying it completely. If the collar you have been given  includes removable pads, remove them every 1-2 days and hand wash them with soap and water. Allow them to air dry. They should be completely dry before you wear them in the collar.  If you are allowed to remove the collar for cleaning and bathing, wash and dry the skin of your neck. Check your skin for irritation or sores. If you see any, tell your health care provider.  Do not drive while wearing the collar.   Only take over-the-counter or prescription medicines for pain, discomfort, or fever as directed by your health care provider.   Keep all follow-up appointments as directed by your health care provider.   Keep all physical therapy appointments as directed by your health care provider.   Make any needed adjustments to your workstation to promote good posture.   Avoid positions and activities that make your symptoms worse.   Warm up and stretch before being active to help prevent problems.  SEEK MEDICAL CARE IF:   Your pain is not controlled with medicine.   You are unable to decrease your pain medicine over time as planned.   Your activity level is not improving as expected.  SEEK IMMEDIATE MEDICAL CARE IF:   You develop any bleeding.  You develop stomach upset.  You have signs of an allergic reaction to your medicine.   Your symptoms get worse.   You develop new, unexplained symptoms.   You have numbness, tingling, weakness, or paralysis in any part of your body.  MAKE SURE YOU:   Understand these instructions.  Will watch your condition.  Will get help right away if you are not doing well or get worse.   This information is not intended to replace advice given to you by your health care provider. Make sure you discuss any questions you have with your health care provider.   Document Released: 11/10/2006 Document Revised: 01/18/2013 Document Reviewed: 07/21/2012 Elsevier Interactive Patient Education 2016 [employer].  Haematologist After a car crash (motor vehicle collision), it is normal to have bruises and sore muscles. The first 24 hours usually feel the worst. After that, you will likely start to feel better each day. HOME CARE  Put ice on the injured area.  Put ice in a plastic bag.  Place a towel between your skin and the bag.  Leave the ice on for 15-20 minutes, 03-04 times a day.  Drink enough fluids to keep your pee (urine) clear or pale yellow.  Do not drink alcohol.  Take a warm shower or bath 1 or 2 times a day. This helps your sore muscles.  Return to activities as told by your doctor. Be careful when lifting. Lifting can make neck or back pain worse.  Only take medicine as told by your doctor. Do not use aspirin. GET HELP RIGHT AWAY IF:   Your arms or legs tingle, feel weak, or  lose feeling (numbness).  You have headaches that do not get better with medicine.  You have neck pain, especially in the middle of the back of your neck.  You cannot control when you pee (urinate) or poop (bowel movement).  Pain is getting worse in any part of your body.  You are short of breath, dizzy, or pass out (faint).  You have chest pain.  You feel sick to your stomach (nauseous), throw up (vomit), or sweat.  You have belly (abdominal) pain that gets worse.  There is blood in your pee, poop, or throw up.  You have pain in your shoulder (shoulder strap areas).  Your problems are getting worse. MAKE SURE YOU:   Understand these instructions.  Will watch your condition.  Will get help right away if you are not doing well or get worse.   This information is not intended to replace advice given to you by your health care provider. Make sure you discuss any questions you have with your health care provider.   Document Released: 07/02/2007 Document Revised: 04/07/2011 Document Reviewed: 06/12/2010 Elsevier Interactive Patient Education 2016 [employer].    Emergency Department  Resource Guide 1) Find a Doctor and Pay Out of Pocket Although you won't have to find out who is covered by your insurance plan, it is a good idea to ask around and get recommendations. You will then need to call the office and see if the doctor you have chosen will accept you as a new patient and what types of options they offer for patients who are self-pay. Some doctors offer discounts or will set up payment plans for their patients who do not have insurance, but you will need to ask so you aren't surprised when you get to your appointment.  2) Contact Your Local Health Department Not all health departments have doctors that can see patients for sick visits, but many do, so it is worth a call to see if yours does. If you don't know where your local health department is, you can check in your phone book. The CDC also has a tool to help you locate your state's health department, and many state websites also have listings of all of their local health departments.  3) Find a Walk-in Clinic If your illness is not likely to be very severe or complicated, you may want to try a walk in clinic. These are popping up all over the country in pharmacies, drugstores, and shopping centers. They're usually staffed by nurse practitioners or physician assistants that have been trained to treat common illnesses and complaints. They're usually fairly quick and inexpensive. However, if you have serious medical issues or chronic medical problems, these are probably not your best option.  No Primary Care Doctor: - Call Health Connect at  [phone] - they can help you locate a primary care doctor that  accepts your insurance, provides certain services, etc. - Physician Referral Service- [phone]  Chronic Pain Problems: Organization         Address  Phone   Notes  [facility]  [phone] Patients need to be referred by their primary care doctor.   Medication Assistance: Organization          Address  Phone   Notes  [facility] Medication [facility] [address] [place] [place], [place] [address] [phone] --Must be a resident of [facility] -- Must have NO insurance coverage whatsoever (no Medicaid/ Medicare, etc.) -- The pt.  MUST have a primary care doctor that directs their care regularly and follows them in the community   [facility]ist  [phone]   [employer]  [phone]    Agencies that provide inexpensive medical care: Organization         Address  Phone   Notes  [facility]  [phone]   [facility]    [phone]   [facility] [address] [place], [place] [address] [phone]   [facility] [address] [place] [phone]   Planned Parenthood    [phone]   [facility]    [phone]   [facility]  [address], [place] Phone:  [phone], Fax:  [phone] Hours of Operation:  9 am - 6 pm, M-F.  Also accepts Medicaid/Medicare and self-pay.  [facility]  [address], [place] Phone: [phone], Fax: [phone]. Hours of Operation:  8:30 am - 5:30 pm, M-F.  Also accepts Medicaid and self-pay.  [facility] [place] [address] [place] Phone: [phone]   [facility] [address] Natasha Bence [place], [place] [phone], Ext. [phone] Mondays & Thursdays: 7-9 AM.  First 15 patients are seen on a first come, first serve basis.    Medicaid-accepting [facility] Providers:  Organization         Address  Phone   Notes  [facility] [address], [place] [phone] Also accepts self-pay patients.  [facility] [address] Laurell Josephs [place], [place]  [phone]   [facility] [address], [place] [phone]   [facility] [address] [place] [phone]   Renaye Rakers [address], [place]   [phone] Only accepts [place] Access [place] patients after they have their name applied to their card.   Self-Pay (no insurance) in [facility]:  Organization         Address  Phone   Notes  Sickle Cell Patients, [facility] Internal Medicine [address] [place], [place] [phone]   [facility] [address] [place], [place] [phone]   [facility] [place]  [address], [place] [phone]   [facility]/Dr. Osei-Bonsu  [address], [place] or [address], [place] [phone] Phone number for both [place] and [place] locations is the same.  [facility] [address] [place] [phone]   [facility] [address], [place] or [address] [phone]   [facility] [address] [place] [phone], phone; [phone], fax Sees patients 1st and 3rd Saturday of every month.  Must not qualify for public or private insurance (i.e. Medicaid, Medicare, [place] Health Choice, Veterans' Benefits)  Household income should be no more than 200% of the poverty level The clinic cannot treat you if you are pregnant or think you are pregnant  Sexually transmitted diseases are not treated at the clinic.    Dental Care: Organization         Address  Phone  Notes  [facility] Department of [facility] [address] [place],  [place] [phone] Accepts children up to age 38 who are enrolled in Medicaid or [place] Health Choice; pregnant women with a Medicaid card; and children who have applied for Medicaid or [place] Health Choice, but were declined, whose parents can pay a reduced fee at time of service.  [facility]  [place]  [address], [place] [phone] Accepts children up to age 38 who are enrolled in [place] or [place] Health Choice; pregnant women with a Medicaid card; and children who have applied for Medicaid or [place] Health Choice, but were declined, whose parents can pay a reduced fee at time of service.  [facility]  [address], [place] [phone] Patients are seen by appointment only. Walk-ins are not accepted. [facility] will see patients 38 years of age and older. Monday - Tuesday (8am-5pm) Most Wednesdays (8:30-5pm) $30 per visit, cash only  [facility]  [address], [facility] [phone] Patients are seen by appointment only. Walk-ins are not accepted. [facility] will see patients 38 years of age and older. One Wednesday Evening (Monthly: Volunteer Based).  $30 per visit, cash only  [facility] [facility]  [phone] for adults; Children under age 574, call Graduate Pediatric Dentistry at [phone]. Children aged 184-14, please call [phone] to request a pediatric application.  Dental services are provided in all areas of dental care including fillings, crowns and bridges, complete and partial dentures, implants, gum treatment, root canals, and extractions. Preventive care is also provided. Treatment is provided to both adults and children. Patients are selected via a lottery and there is often a waiting list.   [facility] [address], [place]  [phone]) [phone] [email or web address]   [facility] [address], [place], [place] [phone], Ext. [phone] Second and Fourth Thursday of each month, opens at 6:30 AM; Clinic ends at 9 AM.  Patients are seen on a first-come first-served basis, and a limited number are seen during each clinic.   [facility]  [address], [place], [place] [phone]   Eligibility Requirements You must  have lived in [place], [place], or [place] counties for at least the last three months.   You cannot be eligible for state or federal sponsored [place] Cityhealthcare insurance, including CIGNAVeterans Administration, [place]icaid, or [employer]icare.   You generally cannot be eligible for healthcare insurance through your employer.    How to apply: Eligibility screenings are held every Tuesday and Wednesday afternoon from 1:00 pm until 4:00 pm. You do not need an appointment for the interview!  [facility] [address], [place], [place] [phone]   [facility]  [phone]   [facility]  [phone]   [facility]  [phone]    Behavioral Health Resources in the Community: Intensive Outpatient Programs Organization         Address  Phone  Notes  [facility] [address], [place], [place] [phone]   [facility] [address], [place], [place] [phone]   [facility] [address], [place], [place]  [phone]   [facility] [address],  [place], [place] [phone] or [phone]   Substance Abuse Resources Organization         Address  Phone  Notes  [facility] Services  [phone]   [facility]  [phone]   [facility] [place]  [phone]   Floydene Flock  [phone]   Residential & Outpatient Substance Abuse Program  [phone]   Psychological Services Organization         Address  Phone  Notes  [facility]  [phone]   [facility]  [phone]   [facility] Mental Health [address], [place] [phone] or [phone]    Mobile Crisis Teams Organization         Address  Phone  Notes  [facility], Mobile Crisis Care Unit  [phone]   [facility]  [address] [place], [place] [phone]   Doristine Locks [address] [place] [phone]    Self-Help/Support Groups Organization         Address  Phone             Notes  [facility] [place] - variety of support groups  [phone]- I7437963 Call for more information  Narcotics Anonymous (NA), [facility]aring [facility] [address], [employer] [place]  2 meetings at this location   Statistician         Address  Phone  Notes  [facility] [address],    [place]  [phone]   [facility]  [address], [place] [address], [place], [place] [phone]   [facility] [address] [place], [place] [phone] Admissions: 8am-3pm M-F  [facility] [address],    [place], [place] [phone]   [facility] [address] [place], [place], [place] [phone]   [facility] [address],  [place], [place] [phone]   [facility] - Intensive Outpatient [address]ure[address], [place], [place] [phone]   [facility] [address] [place] [address]  [place], [place] [phone] or [phone]   [facility] ([facility]) [address] [place], [place] [phone] Accepts Medicaid  [facility] [address],  [place] [phone] Substance Abuse/Addiction Treatment   [facility] Organization         Address  Phone  Notes  [facility]  [phone]   Angie Fava, PhD [address] Ervin Knack [place], [place]   [phone] or [phone]   [facility]   [address] [place], [place] [phone]   [facility] [address], [place], [place] [phone] Insurance/Medicaid/sponsorship through [facility] [address]                                    [place], [place] [phone] Therapy/tele-psych/case  [facility] [address], [place] [phone]    Dr. Lolly Mustache  [phone]   [facility]  [employer] [facility] Dept. 1) [address],  2) [address], [place] 3)  [address], [place] [phone]  [phone]   [facility] Child Abuse Hotline 319 526 1867 or [phone] (After Hours)

## 2017-06-01 ENCOUNTER — Encounter (HOSPITAL_COMMUNITY): Payer: Self-pay | Admitting: *Deleted

## 2017-06-01 ENCOUNTER — Other Ambulatory Visit: Payer: Self-pay

## 2017-06-01 ENCOUNTER — Emergency Department (HOSPITAL_COMMUNITY)
Admission: EM | Admit: 2017-06-01 | Discharge: 2017-06-01 | Disposition: A | Discharge status: Home / Self Care | Attending: Emergency Medicine | Admitting: Emergency Medicine

## 2017-06-01 DIAGNOSIS — J069 Acute upper respiratory infection, unspecified: Secondary | ICD-10-CM

## 2017-06-01 DIAGNOSIS — B9789 Other viral agents as the cause of diseases classified elsewhere: Secondary | ICD-10-CM | POA: Insufficient documentation

## 2017-06-01 MED ORDER — LORATADINE 10 MG PO TABS: 10.0000 mg | ORAL_TABLET | Freq: Every day | ORAL | 0 refills | Status: DC

## 2017-06-01 MED ORDER — BENZONATATE 100 MG PO CAPS: 100.0000 mg | ORAL_CAPSULE | Freq: Three times a day (TID) | ORAL | 0 refills | Status: DC

## 2017-06-01 MED ORDER — FLUTICASONE PROPIONATE 50 MCG/ACT NA SUSP: 1.0000 | Freq: Every day | NASAL | 2 refills | Status: DC

## 2017-06-01 NOTE — ED Provider Notes (Signed)
[facility] EMERGENCY DEPARTMENT Provider Note   CSN: [id number] Arrival date & time: 06/01/17  1410     History   Chief Complaint Chief Complaint  Patient presents with  . sinus drainage    HPI Brady Martin is a 42 y.o. male who presents to ED for evaluation of 1 week history of rhinorrhea, dry cough, sinus pressure.  Sick contacts at home including son with similar symptoms.  He is not taking any medications prior to arrival to help with symptoms.  Also reports itchy, watery eyes which he usually gets during this time of the year.  He denies any fever, nausea, abdominal pain, chest pain, shortness of breath, hemoptysis.  HPI  Past Medical History:  Diagnosis Date  . Kidney stones     There are no active problems to display for this patient.   Past Surgical History:  Procedure Laterality Date  . NO PAST SURGERIES          Home Medications    Prior to Admission medications   Medication Sig Start Date End Date Taking? Authorizing Provider  benzonatate (TESSALON) 100 MG capsule Take 1 capsule (100 mg total) by mouth every 8 (eight) hours. 06/01/17   Joandy Burget, PA-C  fluticasone (FLONASE) 50 MCG/ACT nasal spray Place 1 spray into both nostrils daily. 06/01/17   Mairi Stagliano, PA-C  HYDROcodone-acetaminophen (NORCO) 5-325 MG tablet Take 1 tablet by mouth every 4 (four) hours as needed. 01/09/15   Mancel Bale, MD  loratadine (CLARITIN) 10 MG tablet Take 1 tablet (10 mg total) by mouth daily. 06/01/17   Dietrich Pates, PA-C    Family History No family history on file.  Social History Social History   Tobacco Use  . Smoking status: Never Smoker  . Smokeless tobacco: Never Used  Substance Use Topics  . Alcohol use: No  . Drug use: No     Allergies   Wheat bran; Eggs or egg-derived products; Milk-related compounds; and Soy allergy   Review of Systems Review of Systems  Constitutional: Negative for chills and fever.  HENT: Positive for  congestion, postnasal drip, rhinorrhea, sinus pressure and sneezing. Negative for ear pain, hearing loss, sore throat, tinnitus, trouble swallowing and voice change.   Eyes: Positive for redness and itching. Negative for pain and discharge.  Respiratory: Negative for shortness of breath.   Cardiovascular: Negative for chest pain.  Gastrointestinal: Negative for nausea and vomiting.     Physical Exam Updated Vital Signs BP (!) 136/91 (BP Location: Right Arm)   Pulse 99   Temp 98.9 F (37.2 C) (Oral)   Resp 16   SpO2 99%   Physical Exam  Constitutional: He appears well-developed and well-nourished. No distress.  Nontoxic-appearing and in no acute distress.  HENT:  Head: Normocephalic and atraumatic.  Right Ear: Tympanic membrane normal.  Left Ear: Tympanic membrane normal.  Nose: Rhinorrhea present. Right sinus exhibits maxillary sinus tenderness. Right sinus exhibits no frontal sinus tenderness. Left sinus exhibits maxillary sinus tenderness. Left sinus exhibits no frontal sinus tenderness.  Mouth/Throat: Uvula is midline and oropharynx is clear and moist. No tonsillar exudate.  Postnasal drainage seen.  Eyes: Conjunctivae and EOM are normal. No scleral icterus.  Neck: Normal range of motion.  Cardiovascular: Normal rate, regular rhythm and normal heart sounds.  Pulmonary/Chest: Effort normal and breath sounds normal. No respiratory distress.  Neurological: He is alert.  Skin: No rash noted. He is not diaphoretic.  Psychiatric: He has a normal mood and affect.  Nursing note and vitals reviewed.    ED Treatments / Results  Labs (all labs ordered are listed, but only abnormal results are displayed) Labs Reviewed - No data to display  EKG None  Radiology No results found.  Procedures Procedures (including critical care time)  Medications Ordered in ED Medications - No data to display   Initial Impression / Assessment and Plan / ED Course  I have reviewed the  triage vital signs and the nursing notes.  Pertinent labs & imaging results that were available during my care of the patient were reviewed by me and considered in my medical decision making (see chart for details).     Patient presents to ED for evaluation of 1 week history of rhinorrhea, sinus pressure, itchy, watery eyes, dry cough.  Sick contacts at home with similar symptoms.  Has not tried any medications prior to arrival to help with symptoms.  His symptoms have been going on for 1 weeks he do not see any need to treat with antibiotics at this time.  We will treat with symptomatic treatment including antitussives, nasal spray and antihistamine to help with allergic symptoms and viral URI.  Advised to follow-up with PCP for further evaluation if symptoms persist.  Portions of this note were generated with Dragon dictation software. Dictation errors may occur despite best attempts at proofreading.   Final Clinical Impressions(s) / ED Diagnoses   Final diagnoses:  Viral URI with cough    ED Discharge Orders        Ordered    fluticasone (FLONASE) 50 MCG/ACT nasal spray  Daily     06/01/17 1722    benzonatate (TESSALON) 100 MG capsule  Every 8 hours     06/01/17 1722    loratadine (CLARITIN) 10 MG tablet  Daily     06/01/17 1722       Dietrich Pates, PA-C 06/01/17 1726    Jacalyn Lefevre, MD 06/01/17 Windell Moment

## 2017-06-01 NOTE — ED Notes (Signed)
Patient verbalizes understanding of discharge instructions. Opportunity for questioning and answers were provided. Armband removed by staff, pt discharged from ED ambulatory with family.  

## 2017-06-01 NOTE — ED Triage Notes (Signed)
To ED for eval of a week of sinus congestion and drainage. Pt states he has taken otc meds with some relief. Complains of slight HA but not continuous. Sounds congested. Appears in nad.

## 2017-12-01 ENCOUNTER — Other Ambulatory Visit: Payer: Self-pay

## 2017-12-01 ENCOUNTER — Emergency Department
Admission: EM | Admit: 2017-12-01 | Discharge: 2017-12-01 | Disposition: A | Discharge status: Home / Self Care | Payer: PRIVATE HEALTH INSURANCE | Attending: Emergency Medicine | Admitting: Emergency Medicine

## 2017-12-01 ENCOUNTER — Emergency Department: Payer: PRIVATE HEALTH INSURANCE

## 2017-12-01 DIAGNOSIS — N200 Calculus of kidney: Secondary | ICD-10-CM | POA: Diagnosis not present

## 2017-12-01 DIAGNOSIS — R109 Unspecified abdominal pain: Secondary | ICD-10-CM | POA: Diagnosis present

## 2017-12-01 LAB — URINALYSIS, COMPLETE (UACMP) WITH MICROSCOPIC
BILIRUBIN URINE: NEGATIVE
Bacteria, UA: NONE SEEN
GLUCOSE, UA: NEGATIVE mg/dL
Ketones, ur: 5 mg/dL — AB
LEUKOCYTES UA: NEGATIVE
NITRITE: NEGATIVE
PH: 7 (ref 5.0–8.0)
Protein, ur: NEGATIVE mg/dL
Specific Gravity, Urine: 1.009 (ref 1.005–1.030)

## 2017-12-01 LAB — CBC
HCT: 38.6 % — ABNORMAL LOW (ref 39.0–52.0)
Hemoglobin: 13.2 g/dL (ref 13.0–17.0)
MCH: 30.3 pg (ref 26.0–34.0)
MCHC: 34.2 g/dL (ref 30.0–36.0)
MCV: 88.5 fL (ref 80.0–100.0)
NRBC: 0 % (ref 0.0–0.2)
PLATELETS: 175 10*3/uL (ref 150–400)
RBC: 4.36 MIL/uL (ref 4.22–5.81)
RDW: 13.1 % (ref 11.5–15.5)
WBC: 5.8 10*3/uL (ref 4.0–10.5)

## 2017-12-01 LAB — BASIC METABOLIC PANEL
ANION GAP: 8 (ref 5–15)
BUN: 10 mg/dL (ref 6–20)
CO2: 27 mmol/L (ref 22–32)
Calcium: 9.5 mg/dL (ref 8.9–10.3)
Chloride: 106 mmol/L (ref 98–111)
Creatinine, Ser: 1 mg/dL (ref 0.61–1.24)
GFR calc Af Amer: >60 mL/min (ref >60)
GFR calc non Af Amer: >60 mL/min (ref >60)
GLUCOSE: 90 mg/dL (ref 70–99)
Potassium: 3.6 mmol/L (ref 3.5–5.1)
Sodium: 141 mmol/L (ref 135–145)

## 2017-12-01 MED ORDER — MORPHINE SULFATE (PF) 4 MG/ML IV SOLN
4.0000 mg | Freq: Once | INTRAVENOUS | Status: AC
  Administered 2017-12-01: 4 mg via INTRAVENOUS
  Filled 2017-12-01: qty 1

## 2017-12-01 MED ORDER — ONDANSETRON HCL 4 MG/2ML IJ SOLN
4.0000 mg | Freq: Once | INTRAMUSCULAR | Status: AC
  Administered 2017-12-01: 4 mg via INTRAVENOUS
  Filled 2017-12-01: qty 2

## 2017-12-01 MED ORDER — IBUPROFEN 600 MG PO TABS: 600.0000 mg | ORAL_TABLET | Freq: Four times a day (QID) | ORAL | 0 refills | Status: DC | PRN

## 2017-12-01 MED ORDER — KETOROLAC TROMETHAMINE 30 MG/ML IJ SOLN
15.0000 mg | Freq: Once | INTRAMUSCULAR | Status: AC
  Administered 2017-12-01: 15 mg via INTRAVENOUS
  Filled 2017-12-01: qty 1

## 2017-12-01 MED ORDER — SODIUM CHLORIDE 0.9 % IV BOLUS
500.0000 mL | Freq: Once | INTRAVENOUS | Status: AC
  Administered 2017-12-01: 500 mL via INTRAVENOUS

## 2017-12-01 MED ORDER — TAMSULOSIN HCL 0.4 MG PO CAPS: 0.4000 mg | ORAL_CAPSULE | Freq: Every day | ORAL | 0 refills | Status: AC

## 2017-12-01 MED ORDER — OXYCODONE-ACETAMINOPHEN 5-325 MG PO TABS: 1.0000 | ORAL_TABLET | ORAL | 0 refills | Status: DC | PRN

## 2017-12-01 MED ORDER — TAMSULOSIN HCL 0.4 MG PO CAPS
0.4000 mg | ORAL_CAPSULE | Freq: Once | ORAL | Status: AC
  Administered 2017-12-01: 0.4 mg via ORAL
  Filled 2017-12-01: qty 1

## 2017-12-01 MED ORDER — ONDANSETRON 4 MG PO TBDP: 4.0000 mg | ORAL_TABLET | Freq: Three times a day (TID) | ORAL | 0 refills | Status: DC | PRN

## 2017-12-01 NOTE — ED Provider Notes (Signed)
[facility] Emergency Department Provider Note  ____________________________________________  Time seen: Approximately 7:36 AM  I have reviewed the triage vital signs and the nursing notes.   HISTORY  Chief Complaint Flank Pain   HPI Brady Martin is a 42 y.o. male with history of kidney stones who presents for evaluation of left flank pain.  The pain started at 5 AM, sudden and sharp, located in the left flank and radiating down to his abdomen.  Has had several episodes of nonbloody nonbilious emesis.  At this time the pain is 6 out of 10.  Patient reports that he has been unable to urinate this morning or have a bowel movement. He reports having a BM every morning. No fever but has had chills. No prior abdominal surgeries. Patient has required stenting in the past for previous stones.     Past Medical History:  Diagnosis Date  . Kidney stones     There are no active problems to display for this patient.   Past Surgical History:  Procedure Laterality Date  . NO PAST SURGERIES      Prior to Admission medications   Medication Sig Start Date End Date Taking? Authorizing Provider  benzonatate (TESSALON) 100 MG capsule Take 1 capsule (100 mg total) by mouth every 8 (eight) hours. Patient not taking: Reported on 12/01/2017 06/01/17   Dietrich Pates, PA-C  fluticasone (FLONASE) 50 MCG/ACT nasal spray Place 1 spray into both nostrils daily. Patient not taking: Reported on 12/01/2017 06/01/17   Dietrich Pates, PA-C  HYDROcodone-acetaminophen (NORCO) 5-325 MG tablet Take 1 tablet by mouth every 4 (four) hours as needed. Patient not taking: Reported on 12/01/2017 01/09/15   Mancel Bale, MD  ibuprofen (ADVIL,MOTRIN) 600 MG tablet Take 1 tablet (600 mg total) by mouth every 6 (six) hours as needed. 12/01/17   Nita Sickle, MD  loratadine (CLARITIN) 10 MG tablet Take 1 tablet (10 mg total) by mouth daily. Patient not taking: Reported on 12/01/2017 06/01/17    Dietrich Pates, PA-C  ondansetron (ZOFRAN ODT) 4 MG disintegrating tablet Take 1 tablet (4 mg total) by mouth every 8 (eight) hours as needed for nausea or vomiting. 12/01/17   Nita Sickle, MD  oxyCODONE-acetaminophen (PERCOCET) 5-325 MG tablet Take 1 tablet by mouth every 4 (four) hours as needed for severe pain. 12/01/17   Nita Sickle, MD  tamsulosin (FLOMAX) 0.4 MG CAPS capsule Take 1 capsule (0.4 mg total) by mouth daily for 7 days. 12/01/17 12/08/17  Nita Sickle, MD    Allergies Wheat bran; Eggs or egg-derived products; Milk-related compounds; and Soy allergy  No family history on file.  Social History Social History   Tobacco Use  . Smoking status: Never Smoker  . Smokeless tobacco: Never Used  Substance Use Topics  . Alcohol use: No  . Drug use: No    Review of Systems  Constitutional: Negative for fever. Eyes: Negative for visual changes. ENT: Negative for sore throat. Neck: No neck pain  Cardiovascular: Negative for chest pain. Respiratory: Negative for shortness of breath. Gastrointestinal: Negative for abdominal pain, diarrhea. + N/V Genitourinary: Negative for dysuria. + L flank pain Musculoskeletal: Negative for back pain. Skin: Negative for rash. Neurological: Negative for headaches, weakness or numbness. Psych: No SI or HI  ____________________________________________   PHYSICAL EXAM:  VITAL SIGNS: ED Triage Vitals  Enc Vitals Group     BP 12/01/17 0713 (!) 119/54     Pulse Rate 12/01/17 0713 80     Resp 12/01/17  0713 20     Temp 12/01/17 0713 98.2 F (36.8 C)     Temp Source 12/01/17 0713 Oral     SpO2 12/01/17 0713 100 %     Weight 12/01/17 0711 203 lb (92.1 kg)     Height 12/01/17 0711 5\' 4"  (1.626 m)     Head Circumference --      Peak Flow --      Pain Score 12/01/17 0711 10     Pain Loc --      Pain Edu? --      Excl. in GC? --     Constitutional: Alert and oriented. Well appearing and in no apparent  distress. HEENT:      Head: Normocephalic and atraumatic.         Eyes: Conjunctivae are normal. Sclera is non-icteric.       Mouth/Throat: Mucous membranes are moist.       Neck: Supple with no signs of meningismus. Cardiovascular: Regular rate and rhythm. No murmurs, gallops, or rubs. 2+ symmetrical distal pulses are present in all extremities. No JVD. Respiratory: Normal respiratory effort. Lungs are clear to auscultation bilaterally. No wheezes, crackles, or rhonchi.  Gastrointestinal: Soft, non tender, and non distended with positive bowel sounds. No rebound or guarding. Genitourinary: Mild L CVA tenderness. Musculoskeletal: Nontender with normal range of motion in all extremities. No edema, cyanosis, or erythema of extremities. Neurologic: Normal speech and language. Face is symmetric. Moving all extremities. No gross focal neurologic deficits are appreciated. Skin: Skin is warm, dry and intact. No rash noted. Psychiatric: Mood and affect are normal. Speech and behavior are normal.  ____________________________________________   LABS (all labs ordered are listed, but only abnormal results are displayed)  Labs Reviewed  CBC - Abnormal; Notable for the following components:      Result Value   HCT 38.6 (*)    All other components within normal limits  URINALYSIS, COMPLETE (UACMP) WITH MICROSCOPIC - Abnormal; Notable for the following components:   Color, Urine YELLOW (*)    APPearance CLEAR (*)    Hgb urine dipstick SMALL (*)    Ketones, ur 5 (*)    All other components within normal limits  BASIC METABOLIC PANEL   ____________________________________________  EKG  none  ____________________________________________  RADIOLOGY  I have personally reviewed the images performed during this visit and I agree with the Radiologist's read.   Interpretation by Radiologist:  Ct Renal Stone Study  Result Date: 12/01/2017 CLINICAL DATA:  42 year old male with acute LEFT flank,  abdominal and pelvic pain today. EXAM: CT ABDOMEN AND PELVIS WITHOUT CONTRAST TECHNIQUE: Multidetector CT imaging of the abdomen and pelvis was performed following the standard protocol without IV contrast. COMPARISON:  08/21/2012 and prior exams FINDINGS: Please note that parenchymal abnormalities may be missed without intravenous contrast. Lower chest: Unremarkable Hepatobiliary: The liver and gallbladder are unremarkable. No biliary dilatation. Pancreas: Unremarkable Spleen: Unremarkable Adrenals/Urinary Tract: A 5 mm distal LEFT ureteral calculus (1 cm above the UVJ) causes mild LEFT hydroureteronephrosis. Scattered nonobstructing bilateral renal calculi are identified at least 5 on the RIGHT and 4 on the LEFT, with the largest calculus measuring 6 mm in the RIGHT LOWER pole. The adrenal glands and bladder are unremarkable. Stomach/Bowel: Stomach is within normal limits. Appendix appears normal. No evidence of bowel wall thickening, distention, or inflammatory changes. Vascular/Lymphatic: Iliac atherosclerotic calcifications noted. No abdominal aortic aneurysm. No enlarged lymph nodes identified. Reproductive: Prostate is unremarkable. Other: No free fluid, focal collection, pneumoperitoneum or  abdominal wall hernia. Musculoskeletal: No acute or suspicious bony abnormalities. IMPRESSION: 1. 5 mm distal LEFT ureteral calculus (1 cm above the UVJ) causing mild LEFT hydroureteronephrosis. 2. Nonobstructing bilateral renal calculi 3. Iliac atherosclerotic calcifications, advanced for age. Electronically Signed   By: Harmon Pier M.D.   On: 12/01/2017 07:56      ____________________________________________   PROCEDURES  Procedure(s) performed: None Procedures Critical Care performed:  None ____________________________________________   INITIAL IMPRESSION / ASSESSMENT AND PLAN / ED COURSE   42 y.o. male with history of kidney stones who presents for evaluation of left flank pain, nausea, and vomiting.  Ddx kidney stone, pyelo, PUD, pancreatitis, gastritis, SBO. Plan for labs, urine, and CT renal. WIll give morphine, zofran, and IVF    _________________________ 10:17 AM on 12/01/2017 -----------------------------------------  CT confirms 5mm kidney stone at the L UVJ. Pain is well controlled. UA negative for infection. Creatinine WNL. Patient be discharged home on ibuprofen, Percocet, Flomax, Zofran and referral to urology.  Discussed my standard return precautions.   As part of my medical decision making, I reviewed the following data within the electronic MEDICAL RECORD NUMBER Nursing notes reviewed and incorporated, Labs reviewed , Old chart reviewed, Radiograph reviewed , Notes from prior ED visits and [place] Controlled Substance Database    Pertinent labs & imaging results that were available during my care of the patient were reviewed by me and considered in my medical decision making (see chart for details).    ____________________________________________   FINAL CLINICAL IMPRESSION(S) / ED DIAGNOSES  Final diagnoses:  Kidney stone      NEW MEDICATIONS STARTED DURING THIS VISIT:  ED Discharge Orders         Ordered    ibuprofen (ADVIL,MOTRIN) 600 MG tablet  Every 6 hours PRN     12/01/17 1017    oxyCODONE-acetaminophen (PERCOCET) 5-325 MG tablet  Every 4 hours PRN     12/01/17 1017    ondansetron (ZOFRAN ODT) 4 MG disintegrating tablet  Every 8 hours PRN     12/01/17 1017    tamsulosin (FLOMAX) 0.4 MG CAPS capsule  Daily     12/01/17 1017           Note:  This document was prepared using Dragon voice recognition software and may include unintentional dictation errors.    Don Perking, [place], MD 12/01/17 1019

## 2017-12-01 NOTE — ED Notes (Signed)
Patient transported to CT 

## 2017-12-01 NOTE — ED Triage Notes (Signed)
Pt c/o  Left flank pain with N/V that started around 5am, states he has a hx of kidney stones in the past.

## 2017-12-01 NOTE — Discharge Instructions (Addendum)

## 2017-12-01 NOTE — ED Notes (Signed)
EDP at bedside at this time.  

## 2017-12-01 NOTE — ED Notes (Signed)
EDP at bedside  

## 2017-12-01 NOTE — ED Notes (Signed)
NAD noted at time of D/C. Pt denies questions or concerns. Pt ambulatory to the lobby at this time. Pt visualized leaving with family members, states understanding of driving restrictions.

## 2017-12-02 ENCOUNTER — Ambulatory Visit: Payer: Self-pay | Admitting: Urology

## 2017-12-04 ENCOUNTER — Ambulatory Visit: Payer: Self-pay | Admitting: *Deleted

## 2017-12-04 ENCOUNTER — Ambulatory Visit: Payer: PRIVATE HEALTH INSURANCE | Admitting: Family Medicine

## 2017-12-04 NOTE — Telephone Encounter (Signed)
Patient is calling to report he is having trouble with constipation- he has not been able to have BM since Sunday. Patient states he is passing gas. Patient was seen for kidney stones at ED on Tuesday.  Reason for Disposition . Last bowel movement (BM) > 4 days ago  Answer Assessment - Initial Assessment Questions 1. STOOL PATTERN OR FREQUENCY: "How often do you pass bowel movements (BMs)?"  (Normal range: tid to q 3 days)  "When was the last BM passed?"       1-2 daily- Sunday last BM- small 2. STRAINING: "Do you have to strain to have a BM?"      Sometimes- has problems with constipation 3. RECTAL PAIN: "Does your rectum hurt when the stool comes out?" If so, ask: "Do you have hemorrhoids? How bad is the pain?"  (Scale 1-10; or mild, moderate, severe)     No pain with BM- no history of hemorroids 4. STOOL COMPOSITION: "Are the stools hard?"      Normally soft 5. BLOOD ON STOOLS: "Has there been any blood on the toilet tissue or on the surface of the BM?" If so, ask: "When was the last time?"      no 6. CHRONIC CONSTIPATION: "Is this a new problem for you?"  If no, ask: "How long have you had this problem?" (days, weeks, months)      Yes- no BM since Sunday 7. CHANGES IN DIET: "Have there been any recent changes in your diet?"      Patient did go on cleansing diet- patient has kidney stone- fruits and water for 7 days- stopped yesterday 8. MEDICATIONS: "Have you been taking any new medications?"     Patient was given percocet, flomax 9. LAXATIVES: "Have you been using any laxatives or enemas?"  If yes, ask "What, how often, and when was the last time?"     no 10. CAUSE: "What do you think is causing the constipation?"        Change in diet 11. OTHER SYMPTOMS: "Do you have any other symptoms?" (e.g., abdominal pain, fever, vomiting)       no 12. PREGNANCY: "Is there any chance you are pregnant?" "When was your last menstrual period?"       n/a  Protocols used: CONSTIPATION-A-AH

## 2017-12-11 ENCOUNTER — Encounter: Payer: Self-pay | Admitting: Family Medicine

## 2017-12-11 ENCOUNTER — Ambulatory Visit (INDEPENDENT_AMBULATORY_CARE_PROVIDER_SITE_OTHER): Payer: PRIVATE HEALTH INSURANCE | Admitting: Family Medicine

## 2017-12-11 DIAGNOSIS — N200 Calculus of kidney: Secondary | ICD-10-CM

## 2017-12-11 NOTE — Patient Instructions (Signed)
Dietary Guidelines to Help Prevent Kidney Stones Kidney stones are deposits of minerals and salts that form inside your kidneys. Your risk of developing kidney stones may be greater depending on your diet, your lifestyle, the medicines you take, and whether you have certain medical conditions. Most people can reduce their chances of developing kidney stones by following the instructions below. Depending on your overall health and the type of kidney stones you tend to develop, your dietitian may give you more specific instructions. What are tips for following this plan? Reading food labels  Choose foods with "no salt added" or "low-salt" labels. Limit your sodium intake to less than 1500 mg per day.  Choose foods with calcium for each meal and snack. Try to eat about 300 mg of calcium at each meal. Foods that contain 200-500 mg of calcium per serving include: ? 8 oz (237 ml) of milk, fortified nondairy milk, and fortified fruit juice. ? 8 oz (237 ml) of kefir, yogurt, and soy yogurt. ? 4 oz (118 ml) of tofu. ? 1 oz of cheese. ? 1 cup (300 g) of dried figs. ? 1 cup (91 g) of cooked broccoli. ? 1-3 oz can of sardines or mackerel.  Most people need 1000 to 1500 mg of calcium each day. Talk to your dietitian about how much calcium is recommended for you. Shopping  Buy plenty of fresh fruits and vegetables. Most people do not need to avoid fruits and vegetables, even if they contain nutrients that may contribute to kidney stones.  When shopping for convenience foods, choose: ? Whole pieces of fruit. ? Premade salads with dressing on the side. ? Low-fat fruit and yogurt smoothies.  Avoid buying frozen meals or prepared deli foods.  Look for foods with live cultures, such as yogurt and kefir. Cooking  Do not add salt to food when cooking. Place a salt shaker on the table and allow each person to add his or her own salt to taste.  Use vegetable protein, such as beans, textured vegetable  protein (TVP), or tofu instead of meat in pasta, casseroles, and soups. Meal planning  Eat less salt, if told by your dietitian. To do this: ? Avoid eating processed or premade food. ? Avoid eating fast food.  Eat less animal protein, including cheese, meat, poultry, or fish, if told by your dietitian. To do this: ? Limit the number of times you have meat, poultry, fish, or cheese each week. Eat a diet free of meat at least 2 days a week. ? Eat only one serving each day of meat, poultry, fish, or seafood. ? When you prepare animal protein, cut pieces into small portion sizes. For most meat and fish, one serving is about the size of one deck of cards.  Eat at least 5 servings of fresh fruits and vegetables each day. To do this: ? Keep fruits and vegetables on hand for snacks. ? Eat 1 piece of fruit or a handful of berries with breakfast. ? Have a salad and fruit at lunch. ? Have two kinds of vegetables at dinner.  Limit foods that are high in a substance called oxalate. These include: ? Spinach. ? Rhubarb. ? Beets. ? Potato chips and french fries. ? Nuts.  If you regularly take a diuretic medicine, make sure to eat at least 1-2 fruits or vegetables high in potassium each day. These include: ? Avocado. ? Banana. ? Orange, prune, carrot, or tomato juice. ? Baked potato. ? Cabbage. ? Beans and split peas.   General instructions  Drink enough fluid to keep your urine clear or pale yellow. This is the most important thing you can do.  Talk to your health care provider and dietitian about taking daily supplements. Depending on your health and the cause of your kidney stones, you may be advised: ? Not to take supplements with vitamin C. ? To take a calcium supplement. ? To take a daily probiotic supplement. ? To take other supplements such as magnesium, fish oil, or vitamin B6.  Take all medicines and supplements as told by your health care provider.  Limit alcohol intake to no more  than 1 drink a day for nonpregnant women and 2 drinks a day for men. One drink equals 12 oz of beer, 5 oz of wine, or 1 oz of hard liquor.  Lose weight if told by your health care provider. Work with your dietitian to find strategies and an eating plan that works best for you. What foods are not recommended? Limit your intake of the following foods, or as told by your dietitian. Talk to your dietitian about specific foods you should avoid based on the type of kidney stones and your overall health. Grains Breads. Bagels. Rolls. Baked goods. Salted crackers. Cereal. Pasta. Vegetables Spinach. Rhubarb. Beets. Canned vegetables. Pickles. Olives. Meats and other protein foods Nuts. Nut butters. Large portions of meat, poultry, or fish. Salted or cured meats. Deli meats. Hot dogs. Sausages. Dairy Cheese. Beverages Regular soft drinks. Regular vegetable juice. Seasonings and other foods Seasoning blends with salt. Salad dressings. Canned soups. Soy sauce. Ketchup. Barbecue sauce. Canned pasta sauce. Casseroles. Pizza. Lasagna. Frozen meals. Potato chips. French fries. Summary  You can reduce your risk of kidney stones by making changes to your diet.  The most important thing you can do is drink enough fluid. You should drink enough fluid to keep your urine clear or pale yellow.  Ask your health care provider or dietitian how much protein from animal sources you should eat each day, and also how much salt and calcium you should have each day. This information is not intended to replace advice given to you by your health care provider. Make sure you discuss any questions you have with your health care provider. Document Released: 05/10/2010 Document Revised: 12/25/2015 Document Reviewed: 12/25/2015 Elsevier Interactive Patient Education  2018 [employer]  

## 2017-12-11 NOTE — Assessment & Plan Note (Signed)
-  Pain resolved at this point, likely has passed.  -Discussed dietary changes that may be helpful in prevention  -Remain well hydrated.

## 2017-12-11 NOTE — Progress Notes (Signed)
Brady Martin R Ulin - 42 y.o. male MRN [id number]  Date of birth: 05/05/1975  Subjective Chief Complaint  Patient presents with  . Nephrolithiasis    HPI Brady Martin R [name] is a 42 y.o. male with history of kidney stones here today for follow up of recent kidney stone.  Seen in ED 12/01/17 and found to have 5mm L ureteral calculus.  Started on flomax and given percocet for pain control.  He didn't notice if he passed stone or not however pain has resolved.  He did have some constipation as well last week however this has resolved as well.  He denies fever, chills, changes to urination, hematuria or nausea or vomiting.  ROS:  A comprehensive ROS was completed and negative except as noted per HPI  Allergies  Allergen Reactions  . Wheat Bran Swelling  . Eggs Or Egg-Derived Products Swelling and Rash  . Milk-Related Compounds Swelling and Rash  . Soy Allergy Swelling and Rash    Past Medical History:  Diagnosis Date  . Kidney stones     Past Surgical History:  Procedure Laterality Date  . KIDNEY STONE SURGERY    . NO PAST SURGERIES      Social History   Socioeconomic History  . Marital status: Single    Spouse name: Not on file  . Number of children: Not on file  . Years of education: Not on file  . Highest education level: Not on file  Occupational History  . Not on file  Social Needs  . Financial resource strain: Not on file  . Food insecurity:    Worry: Not on file    Inability: Not on file  . Transportation needs:    Medical: Not on file    Non-medical: Not on file  Tobacco Use  . Smoking status: Former Games developermoker  . Smokeless tobacco: Never Used  Substance and Sexual Activity  . Alcohol use: No  . Drug use: No  . Sexual activity: Not on file  Lifestyle  . Physical activity:    Days per week: Not on file    Minutes per session: Not on file  . Stress: Not on file  Relationships  . Social connections:    Talks on phone: Not on file    Gets together: Not on file      Attends religious service: Not on file    Active member of club or organization: Not on file    Attends meetings of clubs or organizations: Not on file    Relationship status: Not on file  Other Topics Concern  . Not on file  Social History Narrative  . Not on file    History reviewed. No pertinent family history.  Health Maintenance  Topic Date Due  . HIV Screening  01/28/1991  . TETANUS/TDAP  01/28/1995  . INFLUENZA VACCINE  12/18/2018 (Originally 08/27/2017)    ----------------------------------------------------------------------------------------------------------------------------------------------------------------------------------------------------------------- Physical Exam BP 130/80   Pulse 96   Temp 97.7 F (36.5 C) (Oral)   Ht 5\' 4"  (1.626 m)   Wt 199 lb 3.2 oz (90.4 kg)   SpO2 97%   BMI 34.19 kg/m   Physical Exam  Constitutional: He is oriented to person, place, and time. He appears well-nourished. No distress.  HENT:  Head: Normocephalic and atraumatic.  Mouth/Throat: Oropharynx is clear and moist.  Eyes: No scleral icterus.  Neck: Neck supple. No thyromegaly present.  Cardiovascular: Normal rate, regular rhythm and normal heart sounds.  Pulmonary/Chest: Effort normal and breath sounds normal.  Abdominal:  No CVA tenderness   Lymphadenopathy:    He has no cervical adenopathy.  Neurological: He is alert and oriented to person, place, and time.  Skin: Skin is warm and dry.  Psychiatric: He has a normal mood and affect. His behavior is normal.    ------------------------------------------------------------------------------------------------------------------------------------------------------------------------------------------------------------------- Assessment and Plan  Renal calculus -Pain resolved at this point, likely has passed.  -Discussed dietary changes that may be helpful in prevention  -Remain well hydrated.    He will f/u for CPE  with labs in the next few weeks.

## 2017-12-11 NOTE — Progress Notes (Signed)
  Brady Martin - 42 y.o. male MRN [id number]  Date of birth: 05/19/1975  Subjective Chief Complaint  Patient presents with  . Nephrolithiasis    HPI 42 year old presents today for follow up after being hospitalized on Nov. 4th for kidney stone. Patient has not visualized the stone pass. However, Patient reports resolution in pain, urine to be clear and yellow, and denies frequency or urgency. Patient reports increasing fluid intake to pass the stone. Additionally, patient reports experiencing constipation while taking opioids for kidney stone. However, Patient reports now having regular, soft, and daily bowel movements. Patient denies abdominal discomfort.   Allergies  Allergen Reactions  . Wheat Bran Swelling  . Eggs Or Egg-Derived Products Swelling and Rash  . Milk-Related Compounds Swelling and Rash  . Soy Allergy Swelling and Rash    Past Medical History:  Diagnosis Date  . Kidney stones     Past Surgical History:  Procedure Laterality Date  . KIDNEY STONE SURGERY    . NO PAST SURGERIES      Social History   Socioeconomic History  . Marital status: Single    Spouse name: Not on file  . Number of children: Not on file  . Years of education: Not on file  . Highest education level: Not on file  Occupational History  . Not on file  Social Needs  . Financial resource strain: Not on file  . Food insecurity:    Worry: Not on file    Inability: Not on file  . Transportation needs:    Medical: Not on file    Non-medical: Not on file  Tobacco Use  . Smoking status: Former Games developermoker  . Smokeless tobacco: Never Used  Substance and Sexual Activity  . Alcohol use: No  . Drug use: No  . Sexual activity: Not on file  Lifestyle  . Physical activity:    Days per week: Not on file    Minutes per session: Not on file  . Stress: Not on file  Relationships  . Social connections:    Talks on phone: Not on file    Gets together: Not on file    Attends religious service: Not  on file    Active member of club or organization: Not on file    Attends meetings of clubs or organizations: Not on file    Relationship status: Not on file  Other Topics Concern  . Not on file  Social History Narrative  . Not on file    History reviewed. No pertinent family history.  Health Maintenance  Topic Date Due  . HIV Screening  01/28/1991  . TETANUS/TDAP  01/28/1995  . INFLUENZA VACCINE  12/18/2018 (Originally 08/27/2017)   ROS General: Patient reports adequate appetite and denies weight loss or weight gain GU: see HPI GI: see HPI  ----------------------------------------------------------------------------------------------------------------------------------------------------------------------------------------------------------------- Physical Exam BP 130/80   Pulse 96   Temp 97.7 F (36.5 C) (Oral)   Ht 5\' 4"  (1.626 m)   Wt 199 lb 3.2 oz (90.4 kg)   SpO2 97%   BMI 34.19 kg/m   Physical Exam  Constitutional: He appears well-developed and well-nourished.  Cardiovascular: Normal rate, regular rhythm and normal heart sounds.  Pulmonary/Chest: Effort normal and breath sounds normal.  Abdominal: There is no CVA tenderness.  Skin: Skin is warm and dry.    ------------------------------------------------------------------------------------------------------------------------------------------------------------------------------------------------------------------- Assessment and Plan  Nephrolithiasis -Teaching: Diet recommendations printed and discussed. Continue increasing fluids.  -Follow Up: f/u for wellness exam.

## 2017-12-18 ENCOUNTER — Encounter: Payer: Self-pay | Admitting: Family Medicine

## 2017-12-18 ENCOUNTER — Encounter: Payer: Self-pay | Admitting: Emergency Medicine

## 2017-12-18 ENCOUNTER — Ambulatory Visit (INDEPENDENT_AMBULATORY_CARE_PROVIDER_SITE_OTHER): Payer: PRIVATE HEALTH INSURANCE | Admitting: Family Medicine

## 2017-12-18 VITALS — BP 126/90 | HR 110 | Temp 99.2°F | Ht 64.0 in | Wt 198.0 lb

## 2017-12-18 DIAGNOSIS — Z91018 Allergy to other foods: Secondary | ICD-10-CM | POA: Diagnosis not present

## 2017-12-18 DIAGNOSIS — Z91011 Allergy to milk products: Secondary | ICD-10-CM | POA: Diagnosis not present

## 2017-12-18 DIAGNOSIS — K9041 Non-celiac gluten sensitivity: Secondary | ICD-10-CM

## 2017-12-18 DIAGNOSIS — Z0000 Encounter for general adult medical examination without abnormal findings: Secondary | ICD-10-CM | POA: Diagnosis not present

## 2017-12-18 DIAGNOSIS — Z91012 Allergy to eggs: Secondary | ICD-10-CM

## 2017-12-18 NOTE — Patient Instructions (Signed)

## 2017-12-18 NOTE — Assessment & Plan Note (Signed)
Well adult Orders Placed This Encounter  Procedures  . Comp Met (CMET)    Standing Status:   Future    Standing Expiration Date:   12/19/2018  . CBC    Standing Status:   Future    Standing Expiration Date:   12/19/2018  . Lipid Profile    Standing Status:   Future    Standing Expiration Date:   12/19/2018  . TSH    Standing Status:   Future    Standing Expiration Date:   12/19/2018  . Gliadin antibodies, serum    Standing Status:   Future    Standing Expiration Date:   12/19/2018  . Tissue transglutaminase, IgA    Standing Status:   Future    Standing Expiration Date:   12/19/2018  . Reticulin Antibody, IgA w reflex titer    Standing Status:   Future    Standing Expiration Date:   12/19/2018  . Soybean IgE    Standing Status:   Future    Standing Expiration Date:   12/19/2018  . Allergen Profile, Food-Milk    Standing Status:   Future    Standing Expiration Date:   12/19/2018  . Allergen Egg White    Standing Status:   Future    Standing Expiration Date:   12/19/2018  -Recheck/confirm food allergens as he isn't sure he actually has these -Screenings: Lipid panel -Immunizations:  Declines flu vaccine -Risk factor reduction/Anticipatory guidance:  Per AVS

## 2017-12-18 NOTE — Progress Notes (Signed)
Brady Martin - 42 y.o. male MRN [id number]  Date of birth: [birth year]  Subjective Chief Complaint  Patient presents with  . Annual Exam    HPI Brady Martin is a 42 y.o. male with history of kidney stones here today for annual exam.  He reports history of multiple food allergies including wheat, egg, milk and soy.  He does not recall having any issues with these specific foods but says that he had testing while incarcerated showing allergies.  He tries to follow a fairly healthy diet and exercises occasionally.  He does admit to intermittent marijuana and EtOH use.    Review of Systems  Constitutional: Negative for chills, fever, malaise/fatigue and weight loss.  HENT: Negative for congestion, ear pain and sore throat.   Eyes: Negative for blurred vision, double vision and pain.  Respiratory: Negative for cough and shortness of breath.   Cardiovascular: Negative for chest pain and palpitations.  Gastrointestinal: Negative for abdominal pain, blood in stool, constipation, heartburn and nausea.  Genitourinary: Negative for dysuria and urgency.  Musculoskeletal: Negative for joint pain and myalgias.  Neurological: Negative for dizziness and headaches.  Endo/Heme/Allergies: Does not bruise/bleed easily.  Psychiatric/Behavioral: Negative for depression. The patient is not nervous/anxious and does not have insomnia.     Allergies  Allergen Reactions  . Wheat Bran Swelling  . Eggs Or Egg-Derived Products Swelling and Rash  . Milk-Related Compounds Swelling and Rash  . Soy Allergy Swelling and Rash    Past Medical History:  Diagnosis Date  . Kidney stones     Past Surgical History:  Procedure Laterality Date  . KIDNEY STONE SURGERY    . NO PAST SURGERIES      Social History   Socioeconomic History  . Marital status: Single    Spouse name: Not on file  . Number of children: Not on file  . Years of education: Not on file  . Highest education level: Not on file    Occupational History  . Not on file  Social Needs  . Financial resource strain: Not on file  . Food insecurity:    Worry: Not on file    Inability: Not on file  . Transportation needs:    Medical: Not on file    Non-medical: Not on file  Tobacco Use  . Smoking status: Former Research scientist (life sciences)  . Smokeless tobacco: Never Used  Substance and Sexual Activity  . Alcohol use: No  . Drug use: No  . Sexual activity: Not on file  Lifestyle  . Physical activity:    Days per week: Not on file    Minutes per session: Not on file  . Stress: Not on file  Relationships  . Social connections:    Talks on phone: Not on file    Gets together: Not on file    Attends religious service: Not on file    Active member of club or organization: Not on file    Attends meetings of clubs or organizations: Not on file    Relationship status: Not on file  Other Topics Concern  . Not on file  Social History Narrative  . Not on file    History reviewed. No pertinent family history.  Health Maintenance  Topic Date Due  . HIV Screening  01/28/1991  . TETANUS/TDAP  01/28/1995  . INFLUENZA VACCINE  12/18/2018 (Originally 08/27/2017)    ----------------------------------------------------------------------------------------------------------------------------------------------------------------------------------------------------------------- Physical Exam BP 126/90   Pulse (!) 110   Temp 99.2 F (37.3 C) (Oral)  Ht _0  (1.626 m)   Wt 198 lb (89.8 kg)   SpO2 94%   BMI 33.99 kg/m   Physical Exam  Constitutional: He is oriented to person, place, and time. He appears well-nourished. No distress.  HENT:  Head: Normocephalic and atraumatic.  Right Ear: External ear normal.  Left Ear: External ear normal.  Mouth/Throat: Oropharynx is clear and moist.  Eyes: No scleral icterus.  Neck: Normal range of motion. No thyromegaly present.  Cardiovascular: Normal rate, regular rhythm, normal heart sounds and  intact distal pulses.  Pulmonary/Chest: Effort normal and breath sounds normal.  Abdominal: Soft. Bowel sounds are normal. He exhibits no distension. There is no tenderness. There is no guarding.  Musculoskeletal: He exhibits no edema.  Lymphadenopathy:    He has no cervical adenopathy.  Neurological: He is alert and oriented to person, place, and time. No cranial nerve deficit. He exhibits normal muscle tone.  Skin: Skin is warm and dry. No rash noted.  Psychiatric: He has a normal mood and affect. His behavior is normal.    ------------------------------------------------------------------------------------------------------------------------------------------------------------------------------------------------------------------- Assessment and Plan  Well adult exam Well adult Orders Placed This Encounter  Procedures  . Comp Met (CMET)    Standing Status:   Future    Standing Expiration Date:   12/19/2018  . CBC    Standing Status:   Future    Standing Expiration Date:   12/19/2018  . Lipid Profile    Standing Status:   Future    Standing Expiration Date:   12/19/2018  . TSH    Standing Status:   Future    Standing Expiration Date:   12/19/2018  . Gliadin antibodies, serum    Standing Status:   Future    Standing Expiration Date:   12/19/2018  . Tissue transglutaminase, IgA    Standing Status:   Future    Standing Expiration Date:   12/19/2018  . Reticulin Antibody, IgA w reflex titer    Standing Status:   Future    Standing Expiration Date:   12/19/2018  . Soybean IgE    Standing Status:   Future    Standing Expiration Date:   12/19/2018  . Allergen Profile, Food-Milk    Standing Status:   Future    Standing Expiration Date:   12/19/2018  . Allergen Egg White    Standing Status:   Future    Standing Expiration Date:   12/19/2018  -Recheck/confirm food allergens as he isn't sure he actually has these -Screenings: Lipid panel -Immunizations:  Declines flu  vaccine -Risk factor reduction/Anticipatory guidance:  Per AVS

## 2018-01-01 ENCOUNTER — Other Ambulatory Visit: Payer: PRIVATE HEALTH INSURANCE

## 2018-01-01 ENCOUNTER — Other Ambulatory Visit (INDEPENDENT_AMBULATORY_CARE_PROVIDER_SITE_OTHER): Payer: PRIVATE HEALTH INSURANCE

## 2018-01-01 DIAGNOSIS — Z0000 Encounter for general adult medical examination without abnormal findings: Secondary | ICD-10-CM

## 2018-01-01 DIAGNOSIS — Z91012 Allergy to eggs: Secondary | ICD-10-CM

## 2018-01-01 DIAGNOSIS — Z91018 Allergy to other foods: Secondary | ICD-10-CM

## 2018-01-01 DIAGNOSIS — Z91011 Allergy to milk products: Secondary | ICD-10-CM

## 2018-01-01 DIAGNOSIS — K9041 Non-celiac gluten sensitivity: Secondary | ICD-10-CM

## 2018-01-01 LAB — COMPREHENSIVE METABOLIC PANEL
ALT: 10 U/L (ref 0–53)
AST: 17 U/L (ref 0–37)
Albumin: 4.2 g/dL (ref 3.5–5.2)
Alkaline Phosphatase: 46 U/L (ref 39–117)
BUN: 9 mg/dL (ref 6–23)
CALCIUM: 9.5 mg/dL (ref 8.4–10.5)
CHLORIDE: 105 meq/L (ref 96–112)
CO2: 27 meq/L (ref 19–32)
Creatinine, Ser: 0.86 mg/dL (ref 0.40–1.50)
GFR: 125.46 mL/min (ref >60.00)
Glucose, Bld: 95 mg/dL (ref 70–99)
Potassium: 4.1 mEq/L (ref 3.5–5.1)
Sodium: 138 mEq/L (ref 135–145)
Total Bilirubin: 0.3 mg/dL (ref 0.2–1.2)
Total Protein: 7.3 g/dL (ref 6.0–8.3)

## 2018-01-01 LAB — LIPID PANEL
CHOL/HDL RATIO: 3
Cholesterol: 208 mg/dL — ABNORMAL HIGH (ref 0–200)
HDL: 66.3 mg/dL (ref >39.00)
LDL Cholesterol: 131 mg/dL — ABNORMAL HIGH (ref 0–99)
NonHDL: 141.71
TRIGLYCERIDES: 54 mg/dL (ref 0.0–149.0)
VLDL: 10.8 mg/dL (ref 0.0–40.0)

## 2018-01-01 LAB — CBC
HCT: 38.8 % — ABNORMAL LOW (ref 39.0–52.0)
HEMOGLOBIN: 13 g/dL (ref 13.0–17.0)
MCHC: 33.6 g/dL (ref 30.0–36.0)
MCV: 90.1 fl (ref 78.0–100.0)
PLATELETS: 174 10*3/uL (ref 150.0–400.0)
RBC: 4.31 Mil/uL (ref 4.22–5.81)
RDW: 14.2 % (ref 11.5–15.5)
WBC: 4 10*3/uL (ref 4.0–10.5)

## 2018-01-01 LAB — TSH: TSH: 0.59 u[IU]/mL (ref 0.35–4.50)

## 2018-01-04 LAB — ALLERGEN PROFILE, FOOD-MILK
F076-IgE Alpha Lactalbumin: <0.1 kU/L
F078-IgE Casein: <0.1 kU/L
F081-IgE Cheese, Cheddar Type: <0.1 kU/L
F082-IgE Cheese, Mold Type: <0.1 kU/L

## 2018-01-04 LAB — RETICULIN ANTIBODIES, IGA W TITER: Reticulin IGA Screen: NEGATIVE

## 2018-01-04 LAB — ALLERGEN SOYBEAN: CLASS: 0

## 2018-01-04 LAB — GLIADIN ANTIBODIES, SERUM
GLIADIN IGA: 12 U
GLIADIN IGG: 5 U

## 2018-01-04 LAB — IGE EGG WHITE W/COMPONENT RFLX

## 2018-01-04 LAB — TISSUE TRANSGLUTAMINASE, IGA: (tTG) Ab, IgA: 2 U/mL

## 2018-01-04 LAB — INTERPRETATION:

## 2018-01-06 ENCOUNTER — Telehealth: Payer: Self-pay

## 2018-01-06 ENCOUNTER — Encounter: Payer: Self-pay | Admitting: *Deleted

## 2018-01-06 NOTE — Telephone Encounter (Signed)
Copied from CRM [phone]. Topic: General - Other >> Jan 05, 2018  4:49 PM Wyonia HoughJohnson, Chaz E wrote: Reason for CRM: Pt called in for the status or results of his Labs/ please advise

## 2018-01-06 NOTE — Telephone Encounter (Signed)
Result note sent

## 2018-01-23 ENCOUNTER — Emergency Department: Payer: PRIVATE HEALTH INSURANCE

## 2018-01-23 ENCOUNTER — Encounter: Payer: Self-pay | Admitting: Emergency Medicine

## 2018-01-23 ENCOUNTER — Other Ambulatory Visit: Payer: Self-pay

## 2018-01-23 ENCOUNTER — Emergency Department
Admission: EM | Admit: 2018-01-23 | Discharge: 2018-01-23 | Disposition: A | Discharge status: Home / Self Care | Payer: PRIVATE HEALTH INSURANCE | Attending: Emergency Medicine | Admitting: Emergency Medicine

## 2018-01-23 DIAGNOSIS — R0981 Nasal congestion: Secondary | ICD-10-CM | POA: Diagnosis present

## 2018-01-23 DIAGNOSIS — Z87891 Personal history of nicotine dependence: Secondary | ICD-10-CM | POA: Insufficient documentation

## 2018-01-23 DIAGNOSIS — J069 Acute upper respiratory infection, unspecified: Secondary | ICD-10-CM | POA: Diagnosis not present

## 2018-01-23 DIAGNOSIS — B9789 Other viral agents as the cause of diseases classified elsewhere: Secondary | ICD-10-CM

## 2018-01-23 LAB — INFLUENZA PANEL BY PCR (TYPE A & B)
Influenza A By PCR: NEGATIVE
Influenza B By PCR: NEGATIVE

## 2018-01-23 MED ORDER — FLUTICASONE PROPIONATE 50 MCG/ACT NA SUSP: 2.0000 | Freq: Every day | NASAL | 0 refills | Status: DC

## 2018-01-23 MED ORDER — PSEUDOEPH-BROMPHEN-DM 30-2-10 MG/5ML PO SYRP: 5.0000 mL | ORAL_SOLUTION | Freq: Four times a day (QID) | ORAL | 0 refills | Status: DC | PRN

## 2018-01-23 NOTE — ED Provider Notes (Signed)
[facility] Emergency Department Provider Note  ____________________________________________  Time seen: Approximately 8:43 AM  I have reviewed the triage vital signs and the nursing notes.   HISTORY  Chief Complaint URI    HPI Brady Martin is a 42 y.o. male that presents to the emergency department for evaluation of nasal congestion, sore throat, productive cough with dark sputum for 3 days.  Patient is unsure of fever but has felt hot and cold.  He came to the emergency department today because his son is also here to be seen for GI concerns.  No shortness of breath, CP, vomiting, diarrhea, body aches.   Past Medical History:  Diagnosis Date  . Kidney stones     Patient Active Problem List   Diagnosis Date Noted  . Well adult exam 12/18/2017  . Renal calculus 12/11/2017    Past Surgical History:  Procedure Laterality Date  . KIDNEY STONE SURGERY    . NO PAST SURGERIES      Prior to Admission medications   Medication Sig Start Date End Date Taking? Authorizing Provider  brompheniramine-pseudoephedrine-DM 30-2-10 MG/5ML syrup Take 5 mLs by mouth 4 (four) times daily as needed. 01/23/18   Enid DerryWagner, Amias Hutchinson, PA-C  fluticasone (FLONASE) 50 MCG/ACT nasal spray Place 2 sprays into both nostrils daily. 01/23/18 01/23/19  Enid DerryWagner, Donni Oglesby, PA-C  ibuprofen (ADVIL,MOTRIN) 600 MG tablet Take 1 tablet (600 mg total) by mouth every 6 (six) hours as needed. 12/01/17   Nita SickleVeronese, Ranshaw, MD  loratadine (CLARITIN) 10 MG tablet Take 10 mg by mouth daily.    [provider]    Allergies Wheat bran; Eggs or egg-derived products; Milk-related compounds; and Soy allergy  No family history on file.  Social History Social History   Tobacco Use  . Smoking status: Former Games developermoker  . Smokeless tobacco: Never Used  Substance Use Topics  . Alcohol use: No  . Drug use: No     Review of Systems  Constitutional: Positive for chills.  Eyes: No visual  changes. No discharge. ENT: Positive for congestion and rhinorrhea. Cardiovascular: No chest pain. Respiratory: Positive for cough. No SOB. Gastrointestinal: No abdominal pain.  No nausea, no vomiting.  No diarrhea.  No constipation. Musculoskeletal: Negative for musculoskeletal pain. Skin: Negative for rash, abrasions, lacerations, ecchymosis. Neurological: Negative for headaches.   ____________________________________________   PHYSICAL EXAM:  VITAL SIGNS: ED Triage Vitals  Enc Vitals Group     BP 01/23/18 0805 118/81     Pulse Rate 01/23/18 0805 81     Resp 01/23/18 0805 16     Temp 01/23/18 0805 98.7 F (37.1 C)     Temp Source 01/23/18 0805 Oral     SpO2 01/23/18 0805 98 %     Weight 01/23/18 0759 197 lb 15.6 oz (89.8 kg)     Height --      Head Circumference --      Peak Flow --      Pain Score 01/23/18 0758 8     Pain Loc --      Pain Edu? --      Excl. in GC? --      Constitutional: Alert and oriented. Well appearing and in no acute distress. Eyes: Conjunctivae are normal. PERRL. EOMI. No discharge. Head: Atraumatic. ENT: No frontal and maxillary sinus tenderness.      Ears: Tympanic membranes pearly gray with good landmarks. No discharge.      Nose: Mild congestion/rhinnorhea.      Mouth/Throat: Mucous  membranes are moist. Oropharynx non-erythematous. Tonsils not enlarged. No exudates. Uvula midline. Neck: No stridor.   Hematological/Lymphatic/Immunilogical: No cervical lymphadenopathy. Cardiovascular: Normal rate, regular rhythm.  Good peripheral circulation. Respiratory: Normal respiratory effort without tachypnea or retractions. Lungs CTAB. Good air entry to the bases with no decreased or absent breath sounds. Gastrointestinal: Bowel sounds 4 quadrants. Soft and nontender to palpation. No guarding or rigidity. No palpable masses. No distention. Musculoskeletal: Full range of motion to all extremities. No gross deformities appreciated. Neurologic:  Normal  speech and language. No gross focal neurologic deficits are appreciated.  Skin:  Skin is warm, dry and intact. No rash noted. Psychiatric: Mood and affect are normal. Speech and behavior are normal. Patient exhibits appropriate insight and judgement.   ____________________________________________   LABS (all labs ordered are listed, but only abnormal results are displayed)  Labs Reviewed  INFLUENZA PANEL BY PCR (TYPE A & B)   ____________________________________________  EKG   ____________________________________________  RADIOLOGY Lexine BatonI, Makalyn Lennox, personally viewed and evaluated these images (plain radiographs) as part of my medical decision making, as well as reviewing the written report by the radiologist.  Dg Chest 2 View  Result Date: 01/23/2018 CLINICAL DATA:  Cough with black mucous EXAM: CHEST - 2 VIEW COMPARISON:  04/10/2013 FINDINGS: Normal heart size and mediastinal contours. No acute infiltrate or edema. No effusion or pneumothorax. Thoracic spondylosis. No acute osseous findings. IMPRESSION: No evidence of active disease. Electronically Signed   By: Marnee SpringJonathon  Watts M.D.   On: 01/23/2018 09:30    ____________________________________________    PROCEDURES  Procedure(s) performed:    Procedures    Medications - No data to display   ____________________________________________   INITIAL IMPRESSION / ASSESSMENT AND PLAN / ED COURSE  Pertinent labs & imaging results that were available during my care of the patient were reviewed by me and considered in my medical decision making (see chart for details).  Review of the [place] CSRS was performed in accordance of the [facility] prior to dispensing any controlled drugs.     Patient's diagnosis is consistent with viral URI. Vital signs and exam are reassuring.  Chest x-ray negative for acute cardiopulmonary processes.  Influenza test is negative.  Patient appears well and is staying well hydrated. Patient should  alternate tylenol and ibuprofen for fever. Patient feels comfortable going home. Patient will be discharged home with prescriptions for Bromfed and Flonase.  Patient is to follow up with primary care as needed or otherwise directed. Patient is given ED precautions to return to the ED for any worsening or new symptoms.     ____________________________________________  FINAL CLINICAL IMPRESSION(S) / ED DIAGNOSES  Final diagnoses:  Viral URI with cough      NEW MEDICATIONS STARTED DURING THIS VISIT:  ED Discharge Orders         Ordered    brompheniramine-pseudoephedrine-DM 30-2-10 MG/5ML syrup  4 times daily PRN     01/23/18 0957    fluticasone (FLONASE) 50 MCG/ACT nasal spray  Daily     01/23/18 0957              This chart was dictated using voice recognition software/Dragon. Despite best efforts to proofread, errors can occur which can change the meaning. Any change was purely unintentional.    Enid DerryWagner, Onie Hayashi, PA-C 01/23/18 1202    Sharman CheekStafford, Phillip, MD 02/01/18 [phone]

## 2018-01-23 NOTE — ED Triage Notes (Signed)
Cough and nasal congestion x3 days.  Also c/o sore throat with cough.

## 2018-02-07 ENCOUNTER — Encounter (HOSPITAL_COMMUNITY): Payer: Self-pay

## 2018-02-07 ENCOUNTER — Emergency Department (HOSPITAL_COMMUNITY)
Admission: EM | Admit: 2018-02-07 | Discharge: 2018-02-07 | Disposition: A | Discharge status: Home / Self Care | Payer: PRIVATE HEALTH INSURANCE | Attending: Emergency Medicine | Admitting: Emergency Medicine

## 2018-02-07 ENCOUNTER — Other Ambulatory Visit: Payer: Self-pay

## 2018-02-07 DIAGNOSIS — L989 Disorder of the skin and subcutaneous tissue, unspecified: Secondary | ICD-10-CM | POA: Diagnosis present

## 2018-02-07 DIAGNOSIS — L739 Follicular disorder, unspecified: Secondary | ICD-10-CM | POA: Diagnosis not present

## 2018-02-07 DIAGNOSIS — Z87891 Personal history of nicotine dependence: Secondary | ICD-10-CM | POA: Insufficient documentation

## 2018-02-07 MED ORDER — DOXYCYCLINE HYCLATE 100 MG PO CAPS: 100.0000 mg | ORAL_CAPSULE | Freq: Two times a day (BID) | ORAL | 0 refills | Status: DC

## 2018-02-07 MED ORDER — MUPIROCIN 2 % EX OINT: TOPICAL_OINTMENT | CUTANEOUS | 0 refills | Status: DC

## 2018-02-07 MED ORDER — HYDROCODONE-ACETAMINOPHEN 5-325 MG PO TABS: ORAL_TABLET | ORAL | 0 refills | Status: DC

## 2018-02-07 NOTE — ED Triage Notes (Signed)
Pt believes he has 2 abscesses on right testicle as well as one his right underarm. Denies fever and chills. States they are just painful.

## 2018-02-07 NOTE — Discharge Instructions (Addendum)
Take the antibiotic as directed until its finished.  Do warm water soaks or compresses to the affected areas 2-3 times a day until they heal.  Use antibacterial soap.  Keep the areas covered when possible.  Return here in 2 to 3 days if you notice increasing swelling or pain, fever or chills.

## 2018-02-10 NOTE — ED Provider Notes (Signed)
[facility] EMERGENCY DEPARTMENT Provider Note   CSN: [id number] Arrival date & time: 02/07/18  [id number]     History   Chief Complaint Chief Complaint  Patient presents with  . Abscess    HPI Brady Martin is a 43 y.o. male.  HPI   Brady Martin is a 43 y.o. male who presents to the Emergency Department complaining of two small painful "bumps" to his right scrotum and one to his right axilla and right abdomen.  These bumps have been present for several days.  One of the areas on the scroutum has been draining fluid for one day.  He states that his wife has recently had similar looking abscess that improved with taking antibiotic.  He describes the areas as painful to touch.  He denies vomiting, fever, chills and hx of MRSA.  No hx of DM.   Past Medical History:  Diagnosis Date  . Kidney stones     Patient Active Problem List   Diagnosis Date Noted  . Well adult exam 12/18/2017  . Renal calculus 12/11/2017    Past Surgical History:  Procedure Laterality Date  . KIDNEY STONE SURGERY    . NO PAST SURGERIES        Home Medications    Prior to Admission medications   Medication Sig Start Date End Date Taking? Authorizing Provider  brompheniramine-pseudoephedrine-DM 30-2-10 MG/5ML syrup Take 5 mLs by mouth 4 (four) times daily as needed. 01/23/18   Enid DerryWagner, Ashley, PA-C  doxycycline (VIBRAMYCIN) 100 MG capsule Take 1 capsule (100 mg total) by mouth 2 (two) times daily. For 10 days 02/07/18   Izick Gasbarro, PA-C  fluticasone (FLONASE) 50 MCG/ACT nasal spray Place 2 sprays into both nostrils daily. 01/23/18 01/23/19  Enid DerryWagner, Ashley, PA-C  HYDROcodone-acetaminophen (NORCO/VICODIN) 5-325 MG tablet Take one tab po q 4 hrs prn pain 02/07/18   Shalane Florendo, PA-C  ibuprofen (ADVIL,MOTRIN) 600 MG tablet Take 1 tablet (600 mg total) by mouth every 6 (six) hours as needed. 12/01/17   Nita SickleVeronese, Hanahan, MD  loratadine (CLARITIN) 10 MG tablet Take 10 mg by mouth daily.     [provider]  mupirocin ointment (BACTROBAN) 2 % Apply to the affected areas 3 times daily for 10 days 02/07/18   Pauline Ausriplett, Stina Gane, PA-C    Family History No family history on file.  Social History Social History   Tobacco Use  . Smoking status: Former Games developermoker  . Smokeless tobacco: Never Used  Substance Use Topics  . Alcohol use: No  . Drug use: No     Allergies   Wheat bran; Eggs or egg-derived products; Milk-related compounds; and Soy allergy   Review of Systems Review of Systems  Constitutional: Negative for chills and fever.  Gastrointestinal: Negative for abdominal pain, nausea and vomiting.  Genitourinary: Negative for decreased urine volume, difficulty urinating, discharge, frequency, penile pain, penile swelling and testicular pain.       Two small painful bumps to right scrotum  Musculoskeletal: Negative for arthralgias.  Skin: Negative for color change.       Painful bumpd to right abdomen and right axilla.  Hematological: Negative for adenopathy.     Physical Exam Updated Vital Signs BP (!) 116/103 (BP Location: Right Arm)   Pulse 99   Temp 98.3 F (36.8 C) (Oral)   Resp 12   Ht 5\' 4"  (1.626 m)   Wt 89.8 kg   SpO2 96%   BMI 33.99 kg/m   Physical Exam Vitals signs and  nursing note reviewed.  Constitutional:      General: He is not in acute distress.    Appearance: He is well-developed. He is not ill-appearing.  HENT:     Head: Normocephalic.     Mouth/Throat:     Mouth: Mucous membranes are moist.     Pharynx: Oropharynx is clear.  Neck:     Musculoskeletal: Normal range of motion.  Cardiovascular:     Rate and Rhythm: Normal rate and regular rhythm.     Heart sounds: Normal heart sounds. No murmur.  Pulmonary:     Effort: Pulmonary effort is normal. No respiratory distress.     Breath sounds: Normal breath sounds.  Abdominal:     General: There is no distension.     Palpations: Abdomen is soft.     Tenderness: There is no  abdominal tenderness.  Musculoskeletal: Normal range of motion.  Lymphadenopathy:     Cervical: No cervical adenopathy.  Skin:    General: Skin is warm and dry.     Comments: 2 cm and 1 cm tender pustules to the right scrotum.  No surround edema.  Testicle non tender.  1 cm flesh colored papule to right axilla and 2 cm mildly erythematous pustule to the right lower abdominal wall.  No fluctuance or significant surrounding erythema.    Neurological:     Mental Status: He is alert and oriented to person, place, and time.     Motor: No abnormal muscle tone.     Coordination: Coordination normal.      ED Treatments / Results  Labs (all labs ordered are listed, but only abnormal results are displayed) Labs Reviewed - No data to display  EKG None  Radiology No results found.  Procedures Procedures (including critical care time)  Medications Ordered in ED Medications - No data to display   Initial Impression / Assessment and Plan / ED Course  I have reviewed the triage vital signs and the nursing notes.  Pertinent labs & imaging results that were available during my care of the patient were reviewed by me and considered in my medical decision making (see chart for details).     Patient is well appearing.  Non-toxic.   Several small pustules likely folliculitis. Testicles are non tender.  Offered I&D, but pt prefers to try warm compresses and antibiotics.  Agrees to prompt ER return if sx's not improving in 2-3 days or sooner if worsening.    Final Clinical Impressions(s) / ED Diagnoses   Final diagnoses:  Folliculitis    ED Discharge Orders         Ordered    mupirocin ointment (BACTROBAN) 2 %     02/07/18 0901    doxycycline (VIBRAMYCIN) 100 MG capsule  2 times daily     02/07/18 0901    HYDROcodone-acetaminophen (NORCO/VICODIN) 5-325 MG tablet     02/07/18 0901           Pauline Ausriplett, Nyia Tsao, PA-C 02/10/18 2147    Bethann BerkshireZammit, Joseph, MD 02/12/18 2325

## 2018-03-07 ENCOUNTER — Other Ambulatory Visit: Payer: Self-pay

## 2018-03-07 ENCOUNTER — Emergency Department (HOSPITAL_COMMUNITY): Payer: PRIVATE HEALTH INSURANCE

## 2018-03-07 ENCOUNTER — Emergency Department (HOSPITAL_COMMUNITY)
Admission: EM | Admit: 2018-03-07 | Discharge: 2018-03-07 | Disposition: A | Discharge status: Home / Self Care | Payer: PRIVATE HEALTH INSURANCE | Attending: Emergency Medicine | Admitting: Emergency Medicine

## 2018-03-07 DIAGNOSIS — R112 Nausea with vomiting, unspecified: Secondary | ICD-10-CM

## 2018-03-07 DIAGNOSIS — R1013 Epigastric pain: Secondary | ICD-10-CM | POA: Diagnosis present

## 2018-03-07 DIAGNOSIS — Z87891 Personal history of nicotine dependence: Secondary | ICD-10-CM | POA: Diagnosis not present

## 2018-03-07 DIAGNOSIS — R197 Diarrhea, unspecified: Secondary | ICD-10-CM | POA: Diagnosis not present

## 2018-03-07 DIAGNOSIS — Z79899 Other long term (current) drug therapy: Secondary | ICD-10-CM | POA: Insufficient documentation

## 2018-03-07 LAB — COMPREHENSIVE METABOLIC PANEL
ALT: 13 U/L (ref 0–44)
AST: 18 U/L (ref 15–41)
Albumin: 3.8 g/dL (ref 3.5–5.0)
Alkaline Phosphatase: 50 U/L (ref 38–126)
Anion gap: 10 (ref 5–15)
BUN: 8 mg/dL (ref 6–20)
CO2: 22 mmol/L (ref 22–32)
Calcium: 9.6 mg/dL (ref 8.9–10.3)
Chloride: 106 mmol/L (ref 98–111)
Creatinine, Ser: 0.9 mg/dL (ref 0.61–1.24)
GFR calc Af Amer: >60 mL/min (ref >60)
GFR calc non Af Amer: >60 mL/min (ref >60)
Glucose, Bld: 101 mg/dL — ABNORMAL HIGH (ref 70–99)
Potassium: 4.4 mmol/L (ref 3.5–5.1)
Sodium: 138 mmol/L (ref 135–145)
TOTAL PROTEIN: 7.5 g/dL (ref 6.5–8.1)
Total Bilirubin: 0.5 mg/dL (ref 0.3–1.2)

## 2018-03-07 LAB — URINALYSIS, ROUTINE W REFLEX MICROSCOPIC
Bacteria, UA: NONE SEEN
Bilirubin Urine: NEGATIVE
Glucose, UA: NEGATIVE mg/dL
Ketones, ur: 5 mg/dL — AB
Leukocytes, UA: NEGATIVE
Nitrite: NEGATIVE
PH: 7 (ref 5.0–8.0)
Protein, ur: NEGATIVE mg/dL
Specific Gravity, Urine: 1.021 (ref 1.005–1.030)

## 2018-03-07 LAB — LIPASE, BLOOD: Lipase: 31 U/L (ref 11–51)

## 2018-03-07 LAB — CBC WITH DIFFERENTIAL/PLATELET
Abs Immature Granulocytes: 0.02 10*3/uL (ref 0.00–0.07)
Basophils Absolute: 0 10*3/uL (ref 0.0–0.1)
Basophils Relative: 0 %
EOS ABS: 0.1 10*3/uL (ref 0.0–0.5)
EOS PCT: 2 %
HCT: 42.7 % (ref 39.0–52.0)
Hemoglobin: 14 g/dL (ref 13.0–17.0)
Immature Granulocytes: 0 %
Lymphocytes Relative: 12 %
Lymphs Abs: 1 10*3/uL (ref 0.7–4.0)
MCH: 29.4 pg (ref 26.0–34.0)
MCHC: 32.8 g/dL (ref 30.0–36.0)
MCV: 89.5 fL (ref 80.0–100.0)
Monocytes Absolute: 0.6 10*3/uL (ref 0.1–1.0)
Monocytes Relative: 7 %
Neutro Abs: 6 10*3/uL (ref 1.7–7.7)
Neutrophils Relative %: 79 %
Platelets: 166 10*3/uL (ref 150–400)
RBC: 4.77 MIL/uL (ref 4.22–5.81)
RDW: 12.8 % (ref 11.5–15.5)
WBC: 7.7 10*3/uL (ref 4.0–10.5)
nRBC: 0 % (ref 0.0–0.2)

## 2018-03-07 LAB — I-STAT TROPONIN, ED: Troponin i, poc: 0.01 ng/mL (ref 0.00–0.08)

## 2018-03-07 MED ORDER — ONDANSETRON 4 MG PO TBDP: 4.0000 mg | ORAL_TABLET | Freq: Three times a day (TID) | ORAL | 0 refills | Status: DC | PRN

## 2018-03-07 MED ORDER — SODIUM CHLORIDE 0.9 % IV BOLUS
1000.0000 mL | Freq: Once | INTRAVENOUS | Status: AC
  Administered 2018-03-07: 1000 mL via INTRAVENOUS

## 2018-03-07 NOTE — ED Triage Notes (Signed)
PT reports he woke 1.5 Hrs ago with sharp epigastric pain ,N/V/D, cold and clammy. Pt denies passing out. EMS gave zofran IV

## 2018-03-07 NOTE — ED Provider Notes (Signed)
[facility] Provider Note   CSN: [id number] Arrival date & time: 03/07/18  1042     History   Chief Complaint Chief Complaint  Patient presents with  . Abdominal Pain    HPI Brady Martin is a 43 y.o. male with a PMH of Kidney stones presents via EMS with intermittent epigastric abdominal pain onset 1.5 hours ago. Patient reports multiple episodes of vomiting and diarrhea. Patient describes pain as sharp and non radiating. Patient reports associated shortness of breath, but denies chest pain. Patient reports sweats. Patient denies dysuria, hematuria, difficulty urinating, or frequency. Patient states this does not feel like prior kidney stones. Patient denies a personal or family history of heart problems. Patient reports rhinorrhea and congestion for 2-3 days. Patient denies fever, sore throat, headache, or sick contacts. Patient denies recent travel, recent surgery, leg edema/pain, or cough. Patient states he last ate chicken [employer]ib[employer] last night.  Patient denies alcohol, tobacco, or drug use.   HPI  Past Medical History:  Diagnosis Date  . Kidney stones     Patient Active Problem List   Diagnosis Date Noted  . Well adult exam 12/18/2017  . Renal calculus 12/11/2017    Past Surgical History:  Procedure Laterality Date  . KIDNEY STONE SURGERY    . NO PAST SURGERIES          Home Medications    Prior to Admission medications   Medication Sig Start Date End Date Taking? Authorizing Provider  brompheniramine-pseudoephedrine-DM 30-2-10 MG/5ML syrup Take 5 mLs by mouth 4 (four) times daily as needed. 01/23/18   Enid DerryWagner, Ashley, PA-C  doxycycline (VIBRAMYCIN) 100 MG capsule Take 1 capsule (100 mg total) by mouth 2 (two) times daily. For 10 days 02/07/18   Triplett, Tammy, PA-C  fluticasone (FLONASE) 50 MCG/ACT nasal spray Place 2 sprays into both nostrils daily. 01/23/18 01/23/19  Enid DerryWagner, Ashley, PA-C  HYDROcodone-acetaminophen  (NORCO/VICODIN) 5-325 MG tablet Take one tab po q 4 hrs prn pain 02/07/18   Triplett, Tammy, PA-C  ibuprofen (ADVIL,MOTRIN) 600 MG tablet Take 1 tablet (600 mg total) by mouth every 6 (six) hours as needed. 12/01/17   Nita SickleVeronese, [place], MD  loratadine (CLARITIN) 10 MG tablet Take 10 mg by mouth daily.    [provider]  mupirocin ointment (BACTROBAN) 2 % Apply to the affected areas 3 times daily for 10 days 02/07/18   Triplett, Tammy, PA-C  ondansetron (ZOFRAN ODT) 4 MG disintegrating tablet Take 1 tablet (4 mg total) by mouth every 8 (eight) hours as needed for nausea or vomiting. 03/07/18   Leretha DykesHernandez, Khristine Verno P, PA-C    Family History No family history on file.  Social History Social History   Tobacco Use  . Smoking status: Former Games developermoker  . Smokeless tobacco: Never Used  Substance Use Topics  . Alcohol use: No  . Drug use: No     Allergies   Wheat bran; Eggs or egg-derived products; Milk-related compounds; and Soy allergy   Review of Systems Review of Systems  Constitutional: Negative for activity change, appetite change, chills, fever and unexpected weight change.  HENT: Positive for congestion and rhinorrhea. Negative for sore throat.   Eyes: Negative for visual disturbance.  Respiratory: Positive for shortness of breath. Negative for cough.   Cardiovascular: Negative for chest pain.  Gastrointestinal: Positive for abdominal pain, diarrhea, nausea and vomiting. Negative for constipation.  Endocrine: Negative for polydipsia, polyphagia and polyuria.  Genitourinary: Negative for dysuria, flank pain, frequency and hematuria.  Musculoskeletal: Negative for back pain.  Skin: Negative for rash.  Allergic/Immunologic: Negative for immunocompromised state.  Neurological: Negative for weakness and headaches.  Psychiatric/Behavioral: The patient is not nervous/anxious.      Physical Exam Updated Vital Signs BP 118/71 (BP Location: Right Arm)   Pulse (!) 55   Temp 98.6 F  (37 C) (Oral)   Resp 18   Ht 5\' 4"  (1.626 m)   Wt 89.8 kg   SpO2 95%   BMI 33.98 kg/m   Physical Exam Vitals signs and nursing note reviewed.  Constitutional:      General: He is not in acute distress.    Appearance: He is well-developed. He is not diaphoretic.  HENT:     Head: Normocephalic and atraumatic.     Nose: Mucosal edema and congestion present.     Mouth/Throat:     Mouth: Mucous membranes are moist.     Pharynx: Uvula midline. No oropharyngeal exudate.  Eyes:     General: No scleral icterus.    Extraocular Movements: Extraocular movements intact.     Pupils: Pupils are equal, round, and reactive to light.  Neck:     Musculoskeletal: Normal range of motion and neck supple.  Cardiovascular:     Rate and Rhythm: Normal rate and regular rhythm.     Heart sounds: Normal heart sounds. No murmur. No friction rub. No gallop.   Pulmonary:     Effort: Pulmonary effort is normal. No respiratory distress.     Breath sounds: Normal breath sounds. No wheezing or rales.  Abdominal:     General: Bowel sounds are normal. There is no distension.     Palpations: Abdomen is soft. Abdomen is not rigid. There is no mass.     Tenderness: There is no abdominal tenderness. There is no right CVA tenderness, left CVA tenderness, guarding or rebound. Negative signs include Murphy's sign and McBurney's sign.     Hernia: No hernia is present.  Musculoskeletal: Normal range of motion.  Skin:    General: Skin is warm.     Findings: No rash.  Neurological:     Mental Status: He is alert and oriented to person, place, and time.      ED Treatments / Results  Labs (all labs ordered are listed, but only abnormal results are displayed) Labs Reviewed  COMPREHENSIVE METABOLIC PANEL - Abnormal; Notable for the following components:      Result Value   Glucose, Bld 101 (*)    All other components within normal limits  URINALYSIS, ROUTINE W REFLEX MICROSCOPIC - Abnormal; Notable for the  following components:   Hgb urine dipstick SMALL (*)    Ketones, ur 5 (*)    All other components within normal limits  LIPASE, BLOOD  CBC WITH DIFFERENTIAL/PLATELET  I-STAT TROPONIN, ED    EKG EKG Interpretation  Date/Time:  Sunday March 07 2018 10:54:22 EST Ventricular Rate:  69 PR Interval:    QRS Duration: 91 QT Interval:  362 QTC Calculation: 388 R Axis:   80 Text Interpretation:  Sinus rhythm Early repolarization No significant change since last tracing Confirmed by Linwood Dibbles [phone]) on 03/07/2018 11:05:01 AM   Radiology Dg Chest 2 View  Result Date: 03/07/2018 CLINICAL DATA:  43 year old male who woke with sharp epigastric pain this morning. Some shortness of breath. EXAM: CHEST - 2 VIEW COMPARISON:  01/23/2018 and earlier. FINDINGS: Stable lung volumes, somewhat low. Normal cardiac size and mediastinal contours. Visualized tracheal air column is within normal  limits. Both lungs appear clear. No pneumothorax or pleural effusion. No pneumoperitoneum. Normal visible bowel gas pattern in the upper abdomen. No acute osseous abnormality identified. IMPRESSION: Negative.  No cardiopulmonary abnormality. Electronically Signed   By: Odessa Fleming M.D.   On: 03/07/2018 12:54    Procedures Procedures (including critical care time)  Medications Ordered in ED Medications  sodium chloride 0.9 % bolus 1,000 mL (0 mLs Intravenous Stopped 03/07/18 1317)     Initial Impression / Assessment and Plan / ED Course  I have reviewed the triage vital signs and the nursing notes.  Pertinent labs & imaging results that were available during my care of the patient were reviewed by me and considered in my medical decision making (see chart for details).  Clinical Course as of Mar 08 1331  Wynelle Link Mar 07, 2018  1122 CBC is unremarkable.  CBC with Diff [AH]    Clinical Course User Index [AH] Leretha Dykes, PA-C    Patient presents with nausea, vomiting, diarrhea, and abdominal pain. Patient is  nontoxic, nonseptic appearing, in no apparent distress.  Patient's pain and other symptoms adequately managed in emergency department.  Fluid bolus given.  Zofran was provided by EMS. Patient has been asymptomatic while in the ER. Labs, imaging and vitals reviewed.  Patient does not meet the SIRS or Sepsis criteria.  On repeat exam patient does not have a surgical abdomin and there are no peritoneal signs.  No indication of appendicitis, bowel obstruction, bowel perforation, cholecystitis, or diverticulitis. Discussed small amount of blood in urine and the possibility of another kidney stone. Patient denies urinary symptoms and states he is following up with urology regarding kidney stones. Patient declines a CT stone study at this time. Suspect symptoms are viral in nature. Patient discharged home with symptomatic treatment and given strict instructions for follow-up with their primary care physician.  I have also discussed reasons to return immediately to the ER.  Patient expresses understanding and agrees with plan.  Final Clinical Impressions(s) / ED Diagnoses   Final diagnoses:  Nausea vomiting and diarrhea  Epigastric pain    ED Discharge Orders         Ordered    ondansetron (ZOFRAN ODT) 4 MG disintegrating tablet  Every 8 hours PRN     03/07/18 1307           Leretha Dykes, [place] 03/07/18 1336    Linwood Dibbles, MD 03/09/18 1241

## 2018-03-07 NOTE — ED Notes (Signed)
Declined W/C at D/C and was escorted to lobby by RN. 

## 2018-03-07 NOTE — Discharge Instructions (Signed)
You have been seen today for nausea, vomiting, diarrhea, and abdominal pain. Please read and follow all provided instructions.   1. Medications: zofran for nausea, usual home medications 2. Treatment: rest, drink plenty of fluids 3. Follow Up: Please follow up with your primary doctor in 2 days for discussion of your diagnoses and further evaluation after today's visit; if you do not have a primary care doctor use the resource guide provided to find one; Please return to the ER for any new or worsening symptoms. Please obtain all of your results from medical records or have your doctors office obtain the results - share them with your doctor - you should be seen at your doctors office. Call today to arrange your follow up.   Take medications as prescribed. Please review all of the medicines and only take them if you do not have an allergy to them. Return to the emergency room for worsening condition or new concerning symptoms. Follow up with your regular doctor. If you don't have a regular doctor use one of the numbers below to establish a primary care doctor.  Please be aware that if you are taking birth control pills, taking other prescriptions, ESPECIALLY ANTIBIOTICS may make the birth control ineffective - if this is the case, either do not engage in sexual activity or use alternative methods of birth control such as condoms until you have finished the medicine and your family doctor says it is OK to restart them. If you are on a blood thinner such as COUMADIN, be aware that any other medicine that you take may cause the coumadin to either work too much, or not enough - you should have your coumadin level rechecked in next 7 days if this is the case.  ?  It is also a possibility that you have an allergic reaction to any of the medicines that you have been prescribed - Everybody reacts differently to medications and while MOST people have no trouble with most medicines, you may have a reaction such as  nausea, vomiting, rash, swelling, shortness of breath. If this is the case, please stop taking the medicine immediately and contact your physician.  ?  You should return to the ER if you develop severe or worsening symptoms.   Emergency Department Resource Guide 1) Find a Doctor and Pay Out of Pocket Although you won't have to find out who is covered by your insurance plan, it is a good idea to ask around and get recommendations. You will then need to call the office and see if the doctor you have chosen will accept you as a new patient and what types of options they offer for patients who are self-pay. Some doctors offer discounts or will set up payment plans for their patients who do not have insurance, but you will need to ask so you aren't surprised when you get to your appointment.  2) Contact Your Local Health Department Not all health departments have doctors that can see patients for sick visits, but many do, so it is worth a call to see if yours does. If you don't know where your local health department is, you can check in your phone book. The CDC also has a tool to help you locate your state's health department, and many state websites also have listings of all of their local health departments.  3) Find a Walk-in Clinic If your illness is not likely to be very severe or complicated, you may want to try a walk in  clinic. These are popping up all over the country in pharmacies, drugstores, and shopping centers. They're usually staffed by nurse practitioners or physician assistants that have been trained to treat common illnesses and complaints. They're usually fairly quick and inexpensive. However, if you have serious medical issues or chronic medical problems, these are probably not your best option.  No Primary Care Doctor: Call Health Connect at  [phone] - they can help you locate a primary care doctor that  accepts your insurance, provides certain services, etc. Physician Referral  [phone]  Emergency Department Resource Guide 1) Find a Doctor and Pay Out of Pocket Although you won't have to find out who is covered by your insurance plan, it is a good idea to ask around and get recommendations. You will then need to call the office and see if the doctor you have chosen will accept you as a new patient and what types of options they offer for patients who are self-pay. Some doctors offer discounts or will set up payment plans for their patients who do not have insurance, but you will need to ask so you aren't surprised when you get to your appointment.  2) Contact Your Local Health Department Not all health departments have doctors that can see patients for sick visits, but many do, so it is worth a call to see if yours does. If you don't know where your local health department is, you can check in your phone book. The CDC also has a tool to help you locate your state's health department, and many state websites also have listings of all of their local health departments.  3) Find a [facility] If your illness is not likely to be very severe or complicated, you may want to try a walk in clinic. These are popping up all over the country in pharmacies, drugstores, and shopping centers. They're usually staffed by nurse practitioners or physician assistants that have been trained to treat common illnesses and complaints. They're usually fairly quick and inexpensive. However, if you have serious medical issues or chronic medical problems, these are probably not your best option.  No Primary Care Doctor: Call Health Connect at  [phone] - they can help you locate a primary care doctor that  accepts your insurance, provides certain services, etc. Physician Referral Service- [phone]  Chronic Pain Problems: Organization         Address  Phone   Notes  [facility]  [phone] Patients need to be referred by their primary care doctor.    Medication Assistance: Organization         Address  Phone   Notes  [facility] Medication [facility] [address] [place], [place] [address] [phone] --Must be a resident of [facility] -- Must have NO insurance coverage whatsoever (no Medicaid/ Medicare, etc.) -- The pt. MUST have a primary care doctor that directs their care regularly and follows them in the community   [facility]  [phone]   [employer]  [phone]    Agencies that provide inexpensive medical care: Organization         Address  Phone   Notes  [place]  [phone]   [facility]    [phone]   [facility] [place], [place] [address] [phone]   [place] [address] [place] [phone]   [facility]anned Parenthood    (  [phone]   [facility]    [phone]   [facility]  [address], [place] Phone:  [phone], Fax:  [phone] Hours of Operation:  9 am - 6 pm, M-F.  Also accepts Medicaid/Medicare and self-pay.  [facility] for [place] [address], [address], [place] Phone: [phone], Fax: [phone]. Hours of Operation:  8:30 am - 5:30 pm, M-F.  Also accepts Medicaid and self-pay.  [facility] [place] [address], [place] Phone: [phone]   [place], [place], [place] [phone], Ext. [phone] Mondays & Thursdays: 7-9 AM.  First 15 patients are seen on a first come, first serve basis.    [place] Providers:  Organization         Address  Phone   Notes  [facility] [address], [place] [phone] Also accepts self-pay patients.  [facility] [address], [place]  [phone]   [place], [address], [place] [phone]   [facility] [address] [place] [phone]   Lucianne Lei [address], [place]   [phone] Only accepts [place] [facility] [place] patients after they have their name applied to their card.   Self-Pay (no insurance) in [facility]:  Organization         Address  Phone   Notes  Sickle Cell Patients, [facility] [place] [phone]   [facility] [place] [phone]   [facility] [place]  [place], [place], [place] [phone]   [facility]/Dr. Osei-Bonsu  [address], [place] or [address], [place] [phone] Phone number for both [place] and [place] locations is the same.  [facility] [address], [place] [phone]   [facility] [address] [place] or [address] [phone]   [facility] [address] [place] [phone], phone; [phone], fax Sees patients 1st and 3rd Saturday of every month.  Must not qualify for public or private insurance (i.e. Medicaid, Medicare, [place] Health Choice, Veterans' Benefits)  Household income should be no more than 200% of the poverty level The clinic cannot treat you if you are pregnant or think you are pregnant  Sexually transmitted diseases are not treated at the clinic.

## 2018-08-14 ENCOUNTER — Other Ambulatory Visit: Payer: Self-pay

## 2018-08-14 ENCOUNTER — Ambulatory Visit (HOSPITAL_COMMUNITY)
Admission: EM | Admit: 2018-08-14 | Discharge: 2018-08-14 | Disposition: A | Discharge status: Home / Self Care | Payer: PRIVATE HEALTH INSURANCE | Attending: Emergency Medicine | Admitting: Emergency Medicine

## 2018-08-14 DIAGNOSIS — S161XXA Strain of muscle, fascia and tendon at neck level, initial encounter: Secondary | ICD-10-CM | POA: Diagnosis not present

## 2018-08-14 MED ORDER — IBUPROFEN 600 MG PO TABS: 600.0000 mg | ORAL_TABLET | Freq: Four times a day (QID) | ORAL | 0 refills | Status: DC | PRN

## 2018-08-14 MED ORDER — METHOCARBAMOL 500 MG PO TABS: 500.0000 mg | ORAL_TABLET | Freq: Two times a day (BID) | ORAL | 0 refills | Status: DC

## 2018-08-14 NOTE — Discharge Instructions (Addendum)
Take the ibuprofen and muscle relaxer Robaxin as needed.  Do not drive, operate machinery, or drink alcohol while taking the muscle relaxer.    Follow-up with your primary care provider next week if your symptoms persist.    Return here or to the emergency department if you develop worsening pain, numbness, weakness, or other symptoms.

## 2018-08-14 NOTE — ED Provider Notes (Signed)
[facility]    CSN: [id number] Arrival date & time: 08/14/18  1512     History   Chief Complaint Chief Complaint  Patient presents with  . Neck Pain    HPI Brady Martin is a 43 y.o. male.   Patient presents with right-sided neck pain x1 week.  He believes he "slept wrong" and has a "crick" in his neck.  He has tried massage without relief.  He took 1 of his wife's muscle relaxer 5 days ago without relief; he has taken none since or any other attempted treatments.  He denies radiation of the pain, paresthesias, weakness, fever, chills, sore throat, ear pain, SOB, chest pain, or other symptoms.  He denies injury or fall.    The history is provided by the patient.    Past Medical History:  Diagnosis Date  . Kidney stones     Patient Active Problem List   Diagnosis Date Noted  . Well adult exam 12/18/2017  . Renal calculus 12/11/2017    Past Surgical History:  Procedure Laterality Date  . KIDNEY STONE SURGERY    . NO PAST SURGERIES         Home Medications    Prior to Admission medications   Medication Sig Start Date End Date Taking? Authorizing Provider  brompheniramine-pseudoephedrine-DM 30-2-10 MG/5ML syrup Take 5 mLs by mouth 4 (four) times daily as needed. 01/23/18   Laban Emperor, PA-C  doxycycline (VIBRAMYCIN) 100 MG capsule Take 1 capsule (100 mg total) by mouth 2 (two) times daily. For 10 days 02/07/18   Triplett, Tammy, PA-C  fluticasone (FLONASE) 50 MCG/ACT nasal spray Place 2 sprays into both nostrils daily. 01/23/18 01/23/19  Laban Emperor, PA-C  HYDROcodone-acetaminophen (NORCO/VICODIN) 5-325 MG tablet Take one tab po q 4 hrs prn pain 02/07/18   Triplett, Tammy, PA-C  ibuprofen (ADVIL) 600 MG tablet Take 1 tablet (600 mg total) by mouth every 6 (six) hours as needed. 08/14/18   Sharion Balloon, NP  loratadine (CLARITIN) 10 MG tablet Take 10 mg by mouth daily.    [provider]  methocarbamol (ROBAXIN) 500 MG tablet Take 1 tablet  (500 mg total) by mouth 2 (two) times daily. 08/14/18   Sharion Balloon, NP  mupirocin ointment (BACTROBAN) 2 % Apply to the affected areas 3 times daily for 10 days 02/07/18   Triplett, Tammy, PA-C  ondansetron (ZOFRAN ODT) 4 MG disintegrating tablet Take 1 tablet (4 mg total) by mouth every 8 (eight) hours as needed for nausea or vomiting. 03/07/18   Arville Lime, PA-C    Family History No family history on file.  Social History Social History   Tobacco Use  . Smoking status: Former Research scientist (life sciences)  . Smokeless tobacco: Never Used  Substance Use Topics  . Alcohol use: No  . Drug use: No     Allergies   Wheat bran, Eggs or egg-derived products, Milk-related compounds, and Soy allergy   Review of Systems Review of Systems  Constitutional: Negative for chills and fever.  HENT: Negative for ear pain and sore throat.   Eyes: Negative for pain and visual disturbance.  Respiratory: Negative for cough and shortness of breath.   Cardiovascular: Negative for chest pain and palpitations.  Gastrointestinal: Negative for abdominal pain and vomiting.  Genitourinary: Negative for dysuria and hematuria.  Musculoskeletal: Positive for neck pain. Negative for arthralgias and back pain.  Skin: Negative for color change and rash.  Neurological: Negative for seizures, syncope, weakness and numbness.  All other systems reviewed and are negative.    Physical Exam Triage Vital Signs ED Triage Vitals [08/14/18 1539]  Enc Vitals Group     BP 132/86     Pulse Rate 87     Resp 16     Temp 98.3 F (36.8 C)     Temp Source Oral     SpO2 97 %     Weight      Height      Head Circumference      Peak Flow      Pain Score 5     Pain Loc      Pain Edu?      Excl. in GC?    No data found.  Updated Vital Signs BP 132/86 (BP Location: Right Arm)   Pulse 87   Temp 98.3 F (36.8 C) (Oral)   Resp 16   SpO2 97%   Visual Acuity Right Eye Distance:   Left Eye Distance:   Bilateral Distance:     Right Eye Near:   Left Eye Near:    Bilateral Near:     Physical Exam Vitals signs and nursing note reviewed.  Constitutional:      Appearance: He is well-developed.  HENT:     Head: Normocephalic and atraumatic.  Eyes:     Conjunctiva/sclera: Conjunctivae normal.  Neck:     Musculoskeletal: Neck supple.  Cardiovascular:     Rate and Rhythm: Normal rate and regular rhythm.     Heart sounds: No murmur.  Pulmonary:     Effort: Pulmonary effort is normal. No respiratory distress.     Breath sounds: Normal breath sounds.  Abdominal:     Palpations: Abdomen is soft.     Tenderness: There is no abdominal tenderness.  Musculoskeletal:        General: Tenderness present. No deformity or signs of injury.     Comments: Neck: Tenderness and limited range of motion due to pain on right sternocleidmastoid muscle.  No wounds, bruising, rash.  Skin:    General: Skin is warm and dry.  Neurological:     General: No focal deficit present.     Mental Status: He is alert and oriented to person, place, and time.     Sensory: No sensory deficit.     Motor: No weakness.      UC Treatments / Results  Labs (all labs ordered are listed, but only abnormal results are displayed) Labs Reviewed - No data to display  EKG   Radiology No results found.  Procedures Procedures (including critical care time)  Medications Ordered in UC Medications - No data to display  Initial Impression / Assessment and Plan / UC Course  I have reviewed the triage vital signs and the nursing notes.  Pertinent labs & imaging results that were available during my care of the patient were reviewed by me and considered in my medical decision making (see chart for details).   Neck muscle strain.  Treating today with ibuprofen and Robaxin as needed.  Instructed patient not to drive, operate machinery, drink alcohol while taking a muscle relaxer.  Discussed that he should follow-up with his primary care provider  next week if his symptoms persist.  Discussed that he should return here or go to the emergency department if he develops worsening pain, numbness, weakness, fever, chills, sore throat, other concerning symptoms.     Final Clinical Impressions(s) / UC Diagnoses   Final diagnoses:  Strain of neck muscle,  initial encounter     Discharge Instructions     Take the ibuprofen and muscle relaxer Robaxin as needed.  Do not drive, operate machinery, or drink alcohol while taking the muscle relaxer.    Follow-up with your primary care provider next week if your symptoms persist.    Return here or to the emergency department if you develop worsening pain, numbness, weakness, or other symptoms.        ED Prescriptions    Medication Sig Dispense Auth. Provider   ibuprofen (ADVIL) 600 MG tablet Take 1 tablet (600 mg total) by mouth every 6 (six) hours as needed. 30 tablet Mickie Bailate, Jendayi Berling H, NP   methocarbamol (ROBAXIN) 500 MG tablet Take 1 tablet (500 mg total) by mouth 2 (two) times daily. 20 tablet Mickie Bailate, Shenaya Lebo H, NP     Controlled Substance Prescriptions [place] Controlled Substance Registry consulted? Not Applicable   Mickie Bailate, Roselynne Lortz H, NP 08/14/18 1752

## 2018-08-14 NOTE — ED Triage Notes (Signed)
Per pt he has been sleeping on the couch and for about 2 weeks now the right side of his neck has been bothering him. Says it is hard to turn his head. No headaches

## 2018-09-22 ENCOUNTER — Ambulatory Visit (HOSPITAL_COMMUNITY)
Admission: EM | Admit: 2018-09-22 | Discharge: 2018-09-22 | Disposition: A | Discharge status: Home / Self Care | Payer: Self-pay | Attending: Internal Medicine | Admitting: Internal Medicine

## 2018-09-22 ENCOUNTER — Other Ambulatory Visit: Payer: Self-pay

## 2018-09-22 ENCOUNTER — Encounter (HOSPITAL_COMMUNITY): Payer: Self-pay

## 2018-09-22 DIAGNOSIS — Z87442 Personal history of urinary calculi: Secondary | ICD-10-CM

## 2018-09-22 DIAGNOSIS — R319 Hematuria, unspecified: Secondary | ICD-10-CM

## 2018-09-22 DIAGNOSIS — N200 Calculus of kidney: Secondary | ICD-10-CM

## 2018-09-22 DIAGNOSIS — T148XXA Other injury of unspecified body region, initial encounter: Secondary | ICD-10-CM

## 2018-09-22 DIAGNOSIS — M549 Dorsalgia, unspecified: Secondary | ICD-10-CM

## 2018-09-22 LAB — POCT URINALYSIS DIP (DEVICE)
Glucose, UA: NEGATIVE mg/dL
Ketones, ur: NEGATIVE mg/dL
Leukocytes,Ua: NEGATIVE
Nitrite: NEGATIVE
Protein, ur: NEGATIVE mg/dL
Specific Gravity, Urine: 1.025 (ref 1.005–1.030)
Urobilinogen, UA: 1 mg/dL (ref 0.0–1.0)
pH: 6 (ref 5.0–8.0)

## 2018-09-22 MED ORDER — NAPROXEN 375 MG PO TABS: 375.0000 mg | ORAL_TABLET | Freq: Two times a day (BID) | ORAL | 0 refills | Status: DC

## 2018-09-22 MED ORDER — METHOCARBAMOL 500 MG PO TABS: 500.0000 mg | ORAL_TABLET | Freq: Three times a day (TID) | ORAL | 0 refills | Status: DC | PRN

## 2018-09-22 MED ORDER — KETOROLAC TROMETHAMINE 60 MG/2ML IM SOLN
INTRAMUSCULAR | Status: AC
  Filled 2018-09-22: qty 2

## 2018-09-22 MED ORDER — TAMSULOSIN HCL 0.4 MG PO CAPS: 0.4000 mg | ORAL_CAPSULE | Freq: Every day | ORAL | 0 refills | Status: AC

## 2018-09-22 MED ORDER — KETOROLAC TROMETHAMINE 60 MG/2ML IM SOLN
60.0000 mg | Freq: Once | INTRAMUSCULAR | Status: AC
  Administered 2018-09-22: 14:00:00 60 mg via INTRAMUSCULAR

## 2018-09-22 NOTE — ED Triage Notes (Signed)
Pt states he has back pain x 7 days.

## 2018-09-22 NOTE — ED Provider Notes (Signed)
[facility]    CSN: [id number] Arrival date & time: 09/22/18  1253      History   Chief Complaint Chief Complaint  Patient presents with  . Back Pain    HPI Brady Martin is a 43 y.o. male with a history of nephrolithiasis comes to urgent care with complaints of 7-day history of right flank pain.  Symptoms started insidiously and is gotten progressively worse.  Patient currently describes pain as throbbing and sharp with severity of 10 out of 10.  Pain is somewhat worsened by movement and tapping on his back.  No known relieving factors.  Patient denies any urgency frequency, fever or chills.  No nausea or vomiting.  Patient denies any trauma to the back.  No radiation of pain.   HPI  Past Medical History:  Diagnosis Date  . Kidney stones     Patient Active Problem List   Diagnosis Date Noted  . Well adult exam 12/18/2017  . Renal calculus 12/11/2017    Past Surgical History:  Procedure Laterality Date  . KIDNEY STONE SURGERY    . NO PAST SURGERIES         Home Medications    Prior to Admission medications   Medication Sig Start Date End Date Taking? Authorizing Provider  fluticasone (FLONASE) 50 MCG/ACT nasal spray Place 2 sprays into both nostrils daily. 01/23/18 01/23/19  Enid Derry, PA-C  loratadine (CLARITIN) 10 MG tablet Take 10 mg by mouth daily.    [provider]  methocarbamol (ROBAXIN) 500 MG tablet Take 1 tablet (500 mg total) by mouth every 8 (eight) hours as needed for muscle spasms. 09/22/18   Merrilee Jansky, MD  naproxen (NAPROSYN) 375 MG tablet Take 1 tablet (375 mg total) by mouth 2 (two) times daily. 09/22/18   , Britta Mccreedy, MD  tamsulosin (FLOMAX) 0.4 MG CAPS capsule Take 1 capsule (0.4 mg total) by mouth daily for 5 days. 09/22/18 09/27/18  Merrilee Jansky, MD    Family History History reviewed. No pertinent family history.  Social History Social History   Tobacco Use  . Smoking status: Former Games developer  .  Smokeless tobacco: Never Used  Substance Use Topics  . Alcohol use: No  . Drug use: No     Allergies   Wheat bran, Eggs or egg-derived products, Milk-related compounds, and Soy allergy   Review of Systems Review of Systems  Constitutional: Positive for activity change. Negative for chills, fatigue and fever.  HENT: Negative.   Respiratory: Negative.   Cardiovascular: Negative.   Gastrointestinal: Positive for abdominal pain.  Endocrine: Negative.   Genitourinary: Positive for flank pain. Negative for dysuria, frequency, hematuria and urgency.  Musculoskeletal: Positive for arthralgias and back pain. Negative for gait problem, joint swelling and myalgias.  Skin: Negative.   Neurological: Negative.  Negative for weakness, light-headedness and headaches.     Physical Exam Triage Vital Signs ED Triage Vitals  Enc Vitals Group     BP 09/22/18 1337 118/82     Pulse Rate 09/22/18 1337 79     Resp 09/22/18 1337 18     Temp 09/22/18 1337 98.7 F (37.1 C)     Temp src --      SpO2 09/22/18 1337 100 %     Weight 09/22/18 1336 190 lb (86.2 kg)     Height --      Head Circumference --      Peak Flow --      Pain Score  09/22/18 1335 10     Pain Loc --      Pain Edu? --      Excl. in GC? --    No data found.  Updated Vital Signs BP 118/82 (BP Location: Right Arm)   Pulse 79   Temp 98.7 F (37.1 C)   Resp 18   Wt 86.2 kg   SpO2 100%   BMI 32.61 kg/m   Visual Acuity Right Eye Distance:   Left Eye Distance:   Bilateral Distance:    Right Eye Near:   Left Eye Near:    Bilateral Near:     Physical Exam Vitals signs and nursing note reviewed.  Constitutional:      Appearance: He is not ill-appearing, toxic-appearing or diaphoretic.  Cardiovascular:     Rate and Rhythm: Normal rate and regular rhythm.     Pulses: Normal pulses.     Heart sounds: Normal heart sounds.  Pulmonary:     Effort: Pulmonary effort is normal. No respiratory distress.     Breath sounds:  Normal breath sounds. No wheezing, rhonchi or rales.  Abdominal:     General: Bowel sounds are normal. There is no distension.     Palpations: Abdomen is soft.     Tenderness: There is no abdominal tenderness. There is right CVA tenderness. There is no rebound.  Musculoskeletal: Normal range of motion.        General: Tenderness present. No swelling or signs of injury.  Skin:    General: Skin is warm.     Capillary Refill: Capillary refill takes less than 2 seconds.  Neurological:     Mental Status: He is alert.      UC Treatments / Results  Labs (all labs ordered are listed, but only abnormal results are displayed) Labs Reviewed  POCT URINALYSIS DIP (DEVICE) - Abnormal; Notable for the following components:      Result Value   Bilirubin Urine SMALL (*)    Hgb urine dipstick MODERATE (*)    All other components within normal limits    EKG   Radiology No results found.  Procedures Procedures (including critical care time)  Medications Ordered in UC Medications  ketorolac (TORADOL) injection 60 mg (60 mg Intramuscular Given 09/22/18 1403)  ketorolac (TORADOL) 60 MG/2ML injection (has no administration in time range)    Initial Impression / Assessment and Plan / UC Course  I have reviewed the triage vital signs and the nursing notes.  Pertinent labs & imaging results that were available during my care of the patient were reviewed by me and considered in my medical decision making (see chart for details).     1.  Mid back pain with questionable CVA tenderness: Toradol 60 mg IM Urinalysis was remarkable for hematuria I advised the patient to go to emergency department given that this could be renal stone.  Patient has a history of nephrolithiasis.  Patient opted to try oral pain medications and tamsulosin at home with the hope that if this is the stone will pass.  He has passed all stones in the past. If patient's pain is persistent or worsens or if patient develops  fever, chills, nausea or vomiting or gross hematuria he is advised to go to the emergency department to be evaluated.  At this time patient will prefer to use oral pain meds and tamsulosin with the hope that if this is a renal stone that will pass spontaneously. Final Clinical Impressions(s) / UC Diagnoses   Final  diagnoses:  Muscle strain  Nephrolithiasis   Discharge Instructions   None    ED Prescriptions    Medication Sig Dispense Auth. Provider   naproxen (NAPROSYN) 375 MG tablet Take 1 tablet (375 mg total) by mouth 2 (two) times daily. 30 tablet , Myrene Galas, MD   methocarbamol (ROBAXIN) 500 MG tablet Take 1 tablet (500 mg total) by mouth every 8 (eight) hours as needed for muscle spasms. 30 tablet , Myrene Galas, MD   tamsulosin (FLOMAX) 0.4 MG CAPS capsule Take 1 capsule (0.4 mg total) by mouth daily for 5 days. 5 capsule , Myrene Galas, MD     Controlled Substance Prescriptions [place] Controlled Substance Registry consulted? No   Chase Picket, MD 09/23/18 [phone]

## 2018-10-19 ENCOUNTER — Ambulatory Visit (HOSPITAL_COMMUNITY)
Admission: EM | Admit: 2018-10-19 | Discharge: 2018-10-19 | Disposition: A | Discharge status: Home / Self Care | Payer: Self-pay | Attending: Urgent Care | Admitting: Urgent Care

## 2018-10-19 ENCOUNTER — Encounter (HOSPITAL_COMMUNITY): Payer: Self-pay

## 2018-10-19 ENCOUNTER — Other Ambulatory Visit: Payer: Self-pay

## 2018-10-19 DIAGNOSIS — B9689 Other specified bacterial agents as the cause of diseases classified elsewhere: Secondary | ICD-10-CM

## 2018-10-19 DIAGNOSIS — K644 Residual hemorrhoidal skin tags: Secondary | ICD-10-CM

## 2018-10-19 DIAGNOSIS — L739 Follicular disorder, unspecified: Secondary | ICD-10-CM

## 2018-10-19 DIAGNOSIS — L089 Local infection of the skin and subcutaneous tissue, unspecified: Secondary | ICD-10-CM

## 2018-10-19 DIAGNOSIS — K6289 Other specified diseases of anus and rectum: Secondary | ICD-10-CM

## 2018-10-19 MED ORDER — HYDROCORTISONE ACETATE 25 MG RE SUPP: 25.0000 mg | Freq: Two times a day (BID) | RECTAL | 0 refills | Status: DC

## 2018-10-19 MED ORDER — AMOXICILLIN-POT CLAVULANATE 875-125 MG PO TABS: 1.0000 | ORAL_TABLET | Freq: Two times a day (BID) | ORAL | 0 refills | Status: DC

## 2018-10-19 NOTE — ED Triage Notes (Signed)
Patient report he have an abscess between his buttocks for 7 days and groin area for 2 days.

## 2018-10-19 NOTE — Discharge Instructions (Addendum)

## 2018-10-19 NOTE — ED Provider Notes (Signed)
MRN: [id number] DOB: 26-May-1975  Subjective:   Brady Martin is a 43 y.o. male presenting for 7 day history of acute onset worsening buttock pain from a boil, has had daily drainage that is bloody. Has previously been treated with doxycycline, mupirocin for the same problem in 01/2018.  Denies history of hemorrhoids.  However, he admits that he does a lot of heavy lifting for his work.  He also states that he has a hard time defecating, regularly has to sit on the toilet for at least an hour trying to defecate.  No current facility-administered medications for this encounter.   Current Outpatient Medications:  .  fluticasone (FLONASE) 50 MCG/ACT nasal spray, Place 2 sprays into both nostrils daily., Disp: 16 g, Rfl: 0 .  loratadine (CLARITIN) 10 MG tablet, Take 10 mg by mouth daily., Disp: , Rfl:  .  methocarbamol (ROBAXIN) 500 MG tablet, Take 1 tablet (500 mg total) by mouth every 8 (eight) hours as needed for muscle spasms., Disp: 30 tablet, Rfl: 0 .  naproxen (NAPROSYN) 375 MG tablet, Take 1 tablet (375 mg total) by mouth 2 (two) times daily., Disp: 30 tablet, Rfl: 0   Allergies  Allergen Reactions  . Wheat Bran Swelling  . Eggs Or Egg-Derived Products Swelling and Rash  . Milk-Related Compounds Swelling and Rash  . Soy Allergy Swelling and Rash    Past Medical History:  Diagnosis Date  . Kidney stones      Past Surgical History:  Procedure Laterality Date  . KIDNEY STONE SURGERY    . NO PAST SURGERIES      Review of Systems  Constitutional: Negative for fever and malaise/fatigue.  HENT: Negative for congestion, ear pain, sinus pain and sore throat.   Eyes: Negative for blurred vision, double vision, discharge and redness.  Respiratory: Negative for cough, hemoptysis, shortness of breath and wheezing.   Cardiovascular: Negative for chest pain.  Gastrointestinal: Negative for abdominal pain, diarrhea, nausea and vomiting.  Genitourinary: Negative for dysuria, flank pain,  frequency, hematuria and urgency.  Musculoskeletal: Negative for myalgias.  Skin: Positive for rash.  Neurological: Negative for dizziness, weakness and headaches.  Psychiatric/Behavioral: Negative for depression and substance abuse.    Objective:   Vitals: BP 130/88 (BP Location: Left Arm)   Pulse 67   Temp 97.9 F (36.6 C) (Oral)   Resp 16   SpO2 97%   Physical Exam Constitutional:      Appearance: He is well-developed.  Eyes:     General: No scleral icterus. Cardiovascular:     Rate and Rhythm: Normal rate and regular rhythm.     Heart sounds: No murmur. No friction rub. No gallop.   Pulmonary:     Effort: No respiratory distress.     Breath sounds: No wheezing or rales.  Abdominal:     General: Bowel sounds are normal. There is no distension.     Palpations: Abdomen is soft. There is no mass.     Tenderness: There is no abdominal tenderness. There is no guarding or rebound.  Genitourinary:     Skin:    General: Skin is warm and dry.  Neurological:     Mental Status: He is alert and oriented to person, place, and time.      Assessment and Plan :   1. Folliculitis   2. Perianal pain   3. External hemorrhoid   4. Superficial bacterial skin infection     We will trial patient on Augmentin to address his  superficial skin infections, folliculitis.  He has 3 lesions, over the left testicle into around the buttock.  Recommended to use warm compresses.  We will have patient use sitz bath's and hydrocortisone rectal suppositories for his hemorrhoids.  Counseled patient on need for increase fiber intake, consideration for fiber supplement. Counseled patient on potential for adverse effects with medications prescribed/recommended today, ER and return-to-clinic in 2 to 3 days if symptoms persist, patient verbalized understanding.    Jaynee Eagles, [place] 10/19/18 [phone]

## 2019-03-22 ENCOUNTER — Emergency Department (HOSPITAL_COMMUNITY)
Admission: EM | Admit: 2019-03-22 | Discharge: 2019-03-22 | Disposition: A | Discharge status: Home / Self Care | Payer: PRIVATE HEALTH INSURANCE | Attending: Emergency Medicine | Admitting: Emergency Medicine

## 2019-03-22 ENCOUNTER — Emergency Department (HOSPITAL_COMMUNITY): Payer: PRIVATE HEALTH INSURANCE

## 2019-03-22 ENCOUNTER — Other Ambulatory Visit: Payer: Self-pay

## 2019-03-22 ENCOUNTER — Encounter (HOSPITAL_COMMUNITY): Payer: Self-pay | Admitting: Emergency Medicine

## 2019-03-22 DIAGNOSIS — R2231 Localized swelling, mass and lump, right upper limb: Secondary | ICD-10-CM | POA: Insufficient documentation

## 2019-03-22 DIAGNOSIS — Z87891 Personal history of nicotine dependence: Secondary | ICD-10-CM | POA: Insufficient documentation

## 2019-03-22 DIAGNOSIS — Z79899 Other long term (current) drug therapy: Secondary | ICD-10-CM | POA: Insufficient documentation

## 2019-03-22 DIAGNOSIS — M79644 Pain in right finger(s): Secondary | ICD-10-CM | POA: Insufficient documentation

## 2019-03-22 MED ORDER — CEPHALEXIN 500 MG PO CAPS: 500.0000 mg | ORAL_CAPSULE | Freq: Four times a day (QID) | ORAL | 0 refills | Status: DC

## 2019-03-22 MED ORDER — CEPHALEXIN 250 MG PO CAPS
500.0000 mg | ORAL_CAPSULE | Freq: Once | ORAL | Status: AC
  Administered 2019-03-22: 500 mg via ORAL
  Filled 2019-03-22: qty 2

## 2019-03-22 MED ORDER — LIDOCAINE HCL (PF) 1 % IJ SOLN
30.0000 mL | Freq: Once | INTRAMUSCULAR | Status: AC
  Administered 2019-03-22: 30 mL via INTRADERMAL
  Filled 2019-03-22: qty 30

## 2019-03-22 NOTE — ED Notes (Signed)
Patient verbalizes understanding of discharge instructions. Opportunity for questioning and answers were provided. Armband removed by staff, pt discharged from ED. Pt. ambulatory and discharged home.  

## 2019-03-22 NOTE — Discharge Instructions (Signed)
Try to keep finger clean and dry.  Pull your packing out in 2 days.  Change dressings at least once a day. Follow-up with your primary care doctor. Would also recommend hand surgery follow-up, especially if worsening or not improving in the next few days. Please return here for any new/acute changes--- discoloration of finger, fever, increased swelling/redness into the hand, etc.

## 2019-03-22 NOTE — ED Triage Notes (Signed)
Pt reports his right thumb is very painful and swollen.  Started Friday and has not gotten any better.  No apparent injury.

## 2019-03-22 NOTE — ED Provider Notes (Signed)
[facility] EMERGENCY DEPARTMENT Provider Note   CSN: [id number] Arrival date & time: 03/22/19  4010     History Chief Complaint  Patient presents with  . Hand Pain    Brady Martin is a 44 y.o. male.  The history is provided by the patient and medical records.  Hand Pain   44 y.o. M here with right thumb pain.  States this has been ongoing since last Friday.  Right thumb has become increasingly more swollen and painful, worse along the pad of the finger and around the nailbed.  States this has happened before and was due to infection.  He denies fever.  He is right hand dominant.  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  Past Medical History:  Diagnosis Date  . Kidney stones     Patient Active Problem List   Diagnosis Date Noted  . Well adult exam 12/18/2017  . Renal calculus 12/11/2017    Past Surgical History:  Procedure Laterality Date  . KIDNEY STONE SURGERY    . NO PAST SURGERIES         No family history on file.  Social History   Tobacco Use  . Smoking status: Former Research scientist (life sciences)  . Smokeless tobacco: Never Used  Substance Use Topics  . Alcohol use: No  . Drug use: No    Home Medications Prior to Admission medications   Medication Sig Start Date End Date Taking? Authorizing Provider  amoxicillin-clavulanate (AUGMENTIN) 875-125 MG tablet Take 1 tablet by mouth every 12 (twelve) hours. 10/19/18   Jaynee Eagles, PA-C  fluticasone (FLONASE) 50 MCG/ACT nasal spray Place 2 sprays into both nostrils daily. 01/23/18 01/23/19  Laban Emperor, PA-C  hydrocortisone (ANUSOL-HC) 25 MG suppository Place 1 suppository (25 mg total) rectally 2 (two) times daily. 10/19/18   Jaynee Eagles, PA-C  loratadine (CLARITIN) 10 MG tablet Take 10 mg by mouth daily.    [provider]    Allergies    Wheat bran, Eggs or egg-derived products, Milk-related compounds, and Soy allergy  Review of Systems   Review of Systems    Musculoskeletal: Positive for arthralgias.  All other systems reviewed and are negative.   Physical Exam Updated Vital Signs BP (!) 140/93 (BP Location: Left Arm)   Pulse 80   Temp 98.4 F (36.9 C) (Oral)   Resp 20   SpO2 99%   Physical Exam Vitals and nursing note reviewed.  Constitutional:      Appearance: He is well-developed.  HENT:     Head: Normocephalic and atraumatic.  Eyes:     Conjunctiva/sclera: Conjunctivae normal.     Pupils: Pupils are equal, round, and reactive to light.  Cardiovascular:     Rate and Rhythm: Normal rate and regular rhythm.     Heart sounds: Normal heart sounds.  Pulmonary:     Effort: Pulmonary effort is normal.     Breath sounds: Normal breath sounds.  Abdominal:     General: Bowel sounds are normal.     Palpations: Abdomen is soft.  Musculoskeletal:        General: Normal range of motion.     Cervical back: Normal range of motion.     Comments: Right thumb swollen along the pad of the distal phalanx, there is some slight discoloration along the medial aspect and extends to the nailbed, able to flex at the PIP joint but not DIP, normal cap refill, normal distal sensation  Skin:  General: Skin is warm and dry.  Neurological:     Mental Status: He is alert and oriented to person, place, and time.     ED Results / Procedures / Treatments   Labs (all labs ordered are listed, but only abnormal results are displayed) Labs Reviewed - No data to display  EKG None  Radiology DG Finger Thumb Right  Result Date: 03/22/2019 CLINICAL DATA:  Pain and swelling EXAM: RIGHT THUMB 2+V COMPARISON:  None. FINDINGS: There is no evidence of fracture or dislocation. There is no evidence of arthropathy or other focal bone abnormality. Soft tissues are unremarkable. IMPRESSION: Negative. Electronically Signed   By: Jasmine Pang M.D.   On: 03/22/2019 21:13    Procedures Procedures (including critical care time)  INCISION AND DRAINAGE Performed  by: Garlon Hatchet Consent: Verbal consent obtained. Risks and benefits: risks, benefits and alternatives were discussed Type: abscess  Body area: right thumb  Anesthesia: local infiltration  Incision was made with a scalpel.  Local anesthetic: lidocaine 1% without epinephrine  Anesthetic total: 6 ml  Complexity: complex Blunt dissection to break up loculations  Drainage: purulent/bloody  Drainage amount: small  Packing material: 1/4 in iodoform gauze  Patient tolerance: Patient tolerated the procedure well with no immediate complications.     Medications Ordered in ED Medications  lidocaine (PF) (XYLOCAINE) 1 % injection 30 mL (30 mLs Intradermal Given 03/22/19 2335)    ED Course  I have reviewed the triage vital signs and the nursing notes.  Pertinent labs & imaging results that were available during my care of the patient were reviewed by me and considered in my medical decision making (see chart for details).    MDM Rules/Calculators/A&P  44 year old male here with right thumb pain since Friday (4 days).  Has become increasingly more swollen, especially along the medial aspect of the thumb and around the nailbed.  History of same resulting in infection.  On exam he does have diffuse swelling of the distal pad of the right thumb with some discoloration along the medial aspect extending to the nailbed.  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  This is concerning for developing felon.  I&D was performed, small amount of purulent drainage noted.  Packing was inserted, discussed home wound care, removing packing in 2 days, and daily dressing changes. Will start on course of Keflex. He was given hand surgery follow-up.  He will return here for any new or acute changes.  Final Clinical Impression(s) / ED Diagnoses Final diagnoses:  Pain of right thumb    Rx / DC Orders ED Discharge Orders         Ordered     cephALEXin (KEFLEX) 500 MG capsule  4 times daily     03/22/19 2337           Garlon Hatchet, PA-C 03/22/19 2352    Long, Arlyss Repress, MD 03/23/19 [phone]

## 2019-05-09 ENCOUNTER — Other Ambulatory Visit: Payer: Self-pay

## 2019-05-09 ENCOUNTER — Emergency Department (HOSPITAL_COMMUNITY): Payer: PRIVATE HEALTH INSURANCE

## 2019-05-09 ENCOUNTER — Encounter (HOSPITAL_COMMUNITY): Payer: Self-pay | Admitting: Emergency Medicine

## 2019-05-09 ENCOUNTER — Emergency Department (HOSPITAL_COMMUNITY)
Admission: EM | Admit: 2019-05-09 | Discharge: 2019-05-10 | Disposition: A | Discharge status: Home / Self Care | Payer: PRIVATE HEALTH INSURANCE | Attending: Emergency Medicine | Admitting: Emergency Medicine

## 2019-05-09 DIAGNOSIS — G8929 Other chronic pain: Secondary | ICD-10-CM | POA: Insufficient documentation

## 2019-05-09 DIAGNOSIS — Z87891 Personal history of nicotine dependence: Secondary | ICD-10-CM | POA: Insufficient documentation

## 2019-05-09 DIAGNOSIS — M25511 Pain in right shoulder: Secondary | ICD-10-CM | POA: Insufficient documentation

## 2019-05-09 MED ORDER — IBUPROFEN 800 MG PO TABS
800.0000 mg | ORAL_TABLET | Freq: Once | ORAL | Status: AC
  Administered 2019-05-09: 800 mg via ORAL
  Filled 2019-05-09: qty 1

## 2019-05-09 MED ORDER — IBUPROFEN 800 MG PO TABS: 800.0000 mg | ORAL_TABLET | Freq: Three times a day (TID) | ORAL | 0 refills | Status: DC

## 2019-05-09 MED ORDER — HYDROCODONE-ACETAMINOPHEN 5-325 MG PO TABS
1.0000 | ORAL_TABLET | Freq: Once | ORAL | Status: AC
  Administered 2019-05-09: 23:00:00 1 via ORAL
  Filled 2019-05-09: qty 1

## 2019-05-09 MED ORDER — METHOCARBAMOL 500 MG PO TABS: 500.0000 mg | ORAL_TABLET | Freq: Three times a day (TID) | ORAL | 0 refills | Status: DC

## 2019-05-09 NOTE — Discharge Instructions (Signed)
Apply ice packs on and off to your shoulder. Follow-up with one of the orthopedic providers listed.

## 2019-05-09 NOTE — ED Notes (Signed)
Patient taken to xray.

## 2019-05-09 NOTE — ED Triage Notes (Signed)
Pt states hes having right shoulder pain x4 weeks. Pt denies injury to the shoulder.

## 2019-05-09 NOTE — ED Provider Notes (Signed)
Brady Martin Provider Note   CSN: [id number] Arrival date & time: 05/09/19  2007     History Chief Complaint  Patient presents with  . Shoulder Pain    Brady Martin is a 44 y.o. male.  HPI      Brady Martin is a 44 y.o. male who presents to the [facility] complaining of pain of his right shoulder. Symptoms have been present for 1 month. 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 He describes an aching pain along his right trapezius muscle that radiates into the shoulder joint and down his right arm to the level of his elbow. Pain is worse with abduction of the arm and with rotation at the shoulder joint. Pain is improved when the arm is held to his side. He denies numbness or tingling of his arm for fingers. He also denies neck pain. No fever, neck stiffness, or headaches.  Past Medical History:  Diagnosis Date  . Kidney stones     Patient Active Problem List   Diagnosis Date Noted  . Well adult exam 12/18/2017  . Renal calculus 12/11/2017    Past Surgical History:  Procedure Laterality Date  . KIDNEY STONE SURGERY    . NO PAST SURGERIES         History reviewed. No pertinent family history.  Social History   Tobacco Use  . Smoking status: Former Research scientist (life sciences)  . Smokeless tobacco: Never Used  Substance Use Topics  . Alcohol use: No  . Drug use: No    Home Medications Prior to Admission medications   Not on File    Allergies    Wheat bran, Eggs or egg-derived products, Milk-related compounds, and Soy allergy  Review of Systems   Review of Systems  Constitutional: Negative for chills and fever.  Respiratory: Negative for shortness of breath.   Cardiovascular: Negative for chest pain.  Gastrointestinal: Negative for abdominal pain, diarrhea and nausea.  Musculoskeletal: Positive for arthralgias (Right shoulder pain). Negative for joint swelling, neck pain and neck  stiffness.  Skin: Negative for color change and wound.  Neurological: Negative for dizziness, weakness, numbness and headaches.    Physical Exam Updated Vital Signs BP 120/77 (BP Location: Left Arm)   Pulse 87   Temp 99.1 F (37.3 C) (Oral)   Resp 14   Ht 5\' 5"  (1.651 m)   Wt 79.4 kg   SpO2 97%   BMI 29.12 kg/m   Physical Exam Vitals and nursing note reviewed.  Constitutional:      Appearance: Normal appearance. He is not ill-appearing.  HENT:     Head: Atraumatic.  Cardiovascular:     Rate and Rhythm: Normal rate and regular rhythm.     Pulses: Normal pulses.  Pulmonary:     Effort: Pulmonary effort is normal.     Breath sounds: Normal breath sounds.  Chest:     Chest wall: No tenderness.  Musculoskeletal:        General: No tenderness or signs of injury.     Cervical back: Normal range of motion.     Comments: Tenderness to palpation of the anterior right shoulder joint. No crepitus on range of motion. Limited abduction on exam and due to level of pain. No erythema or excessive warmth of the joint. Mild tenderness palpation of the right trapezius muscle. No tenderness of the cervical spine or paraspinal muscles.  Skin:    General:  Skin is warm.     Capillary Refill: Capillary refill takes less than 2 seconds.     Findings: No erythema or rash.  Neurological:     General: No focal deficit present.     Mental Status: He is alert.     Sensory: No sensory deficit.     Motor: No weakness.     ED Results / Procedures / Treatments   Labs (all labs ordered are listed, but only abnormal results are displayed) Labs Reviewed - No data to display  EKG None  Radiology DG Shoulder Right  Result Date: 05/09/2019 CLINICAL DATA:  Shoulder pain EXAM: RIGHT SHOULDER - 2+ VIEW COMPARISON:  None. FINDINGS: There is no evidence of fracture or dislocation. Mild acromioclavicular arthrosis. Soft tissues are unremarkable. IMPRESSION: No fracture or dislocation of the right  shoulder. Mild acromioclavicular arthrosis. Electronically Signed   By: Brady Martin M.D.   On: 05/09/2019 22:38    Procedures Procedures (including critical care time)  Medications Ordered in ED Medications  HYDROcodone-acetaminophen (NORCO/VICODIN) 5-325 MG per tablet 1 tablet (1 tablet Oral Given 05/09/19 2253)  ibuprofen (ADVIL) tablet 800 mg (800 mg Oral Given 05/09/19 2253)    ED Course  I have reviewed the triage vital signs and the nursing notes.  Pertinent labs & imaging results that were available during my care of the patient were reviewed by me and considered in my medical decision making (see chart for details).    MDM Rules/Calculators/A&P                        Patient with 1 month history of right shoulder pain associated with movement. No concerning symptoms for septic joint. No cervical tenderness. No focal neuro deficits on exam. I feel that symptoms may be related to rotator cuff injury. Patient agrees to symptomatic treatment. He is well-appearing and nontoxic. He is appropriate for discharge home and agrees to orthopedic follow-up  Final Clinical Impression(s) / ED Diagnoses Final diagnoses:  Chronic right shoulder pain    Rx / DC Orders ED Discharge Orders    None       Pauline Aus, PA-C 05/09/19 2346    Bethann Berkshire, MD 05/11/19 1028

## 2019-05-10 NOTE — ED Notes (Signed)
Patient given an ice pack. 

## 2019-08-06 IMAGING — DX DG CHEST 2V
2 series · 2 of 2 positions shown · non-contrast
Comparison: 01/23/2018 and earlier.

CLINICAL DATA: 43-year-old male who woke with sharp epigastric pain
this morning. Some shortness of breath.

EXAM:
CHEST - 2 VIEW

[w chest pa]
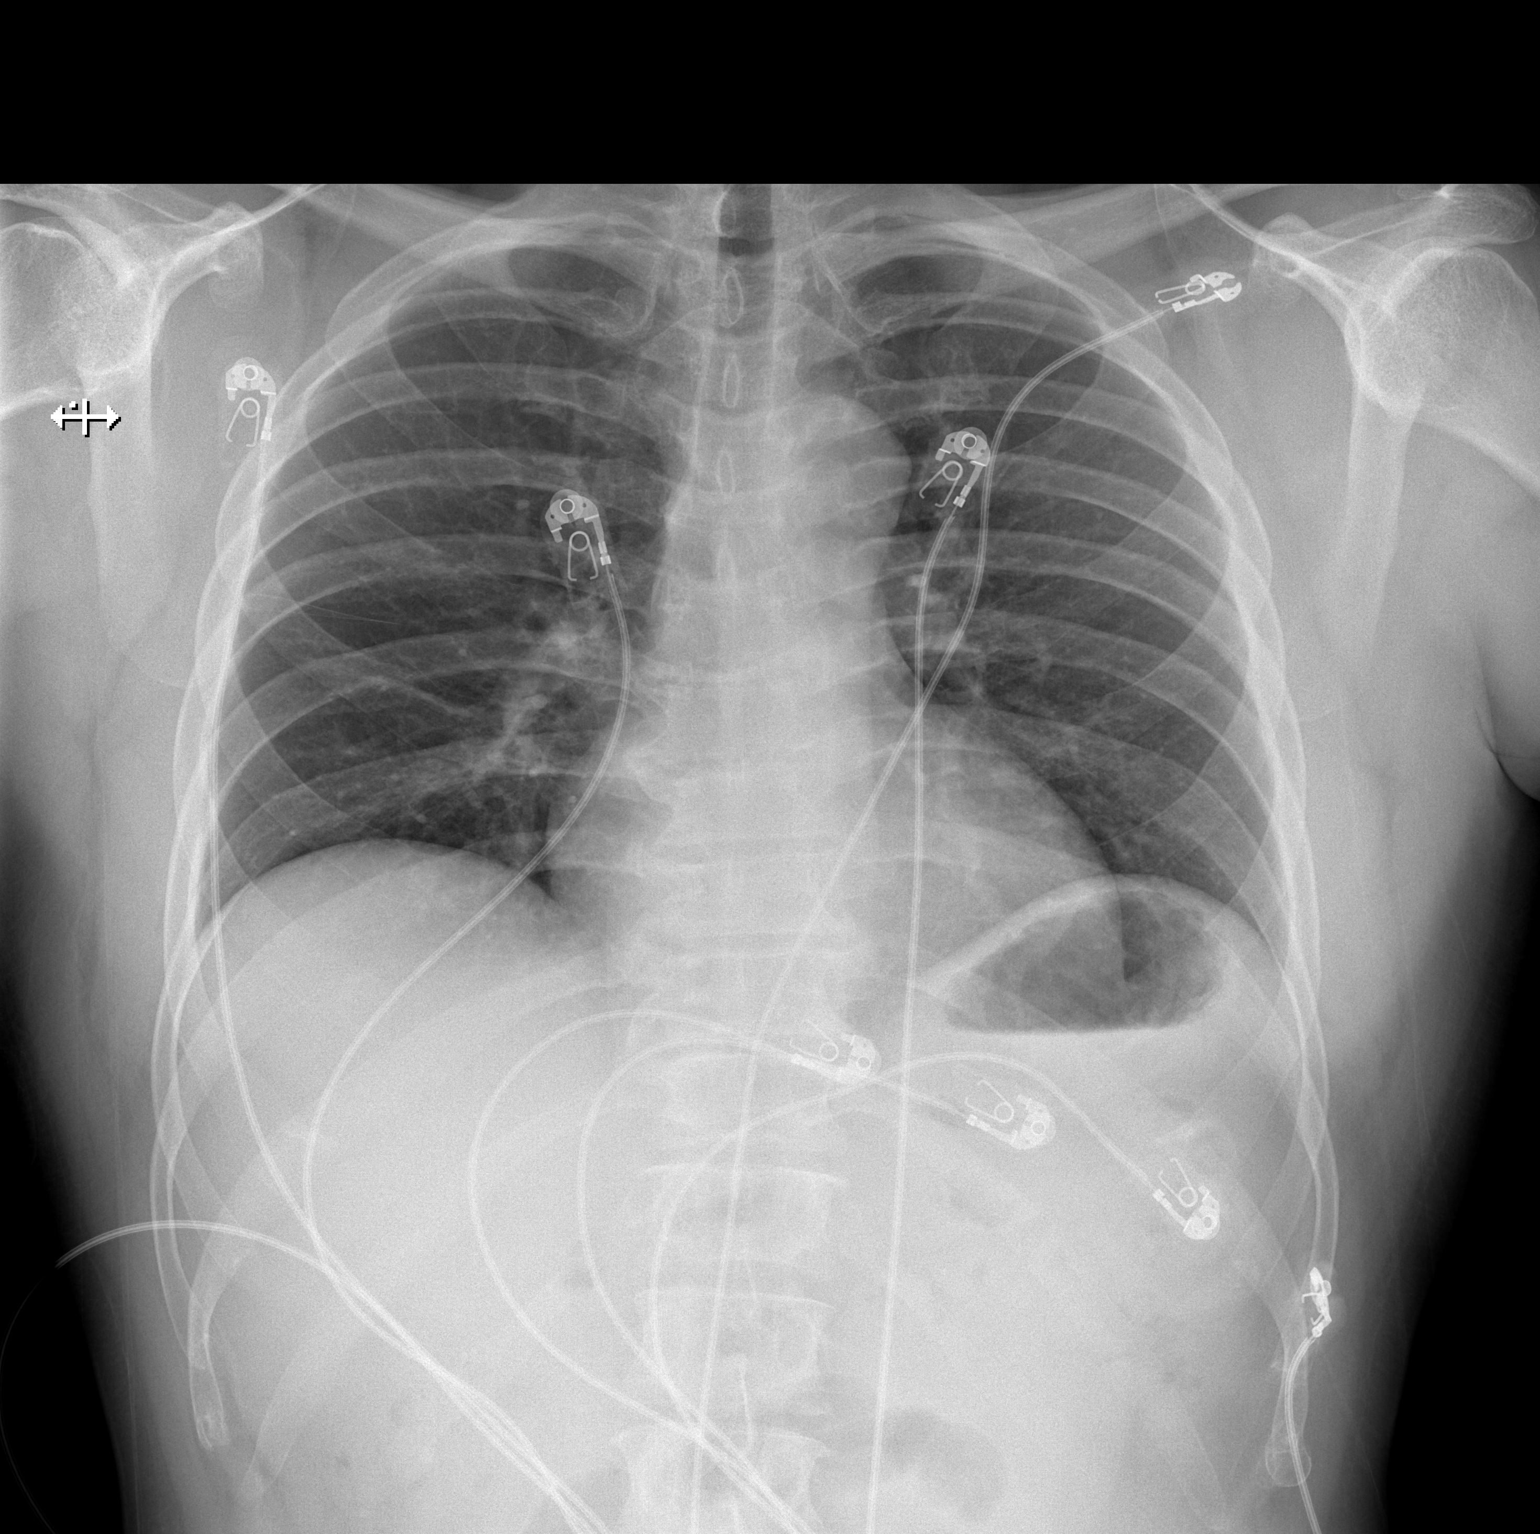

[w chest lat]
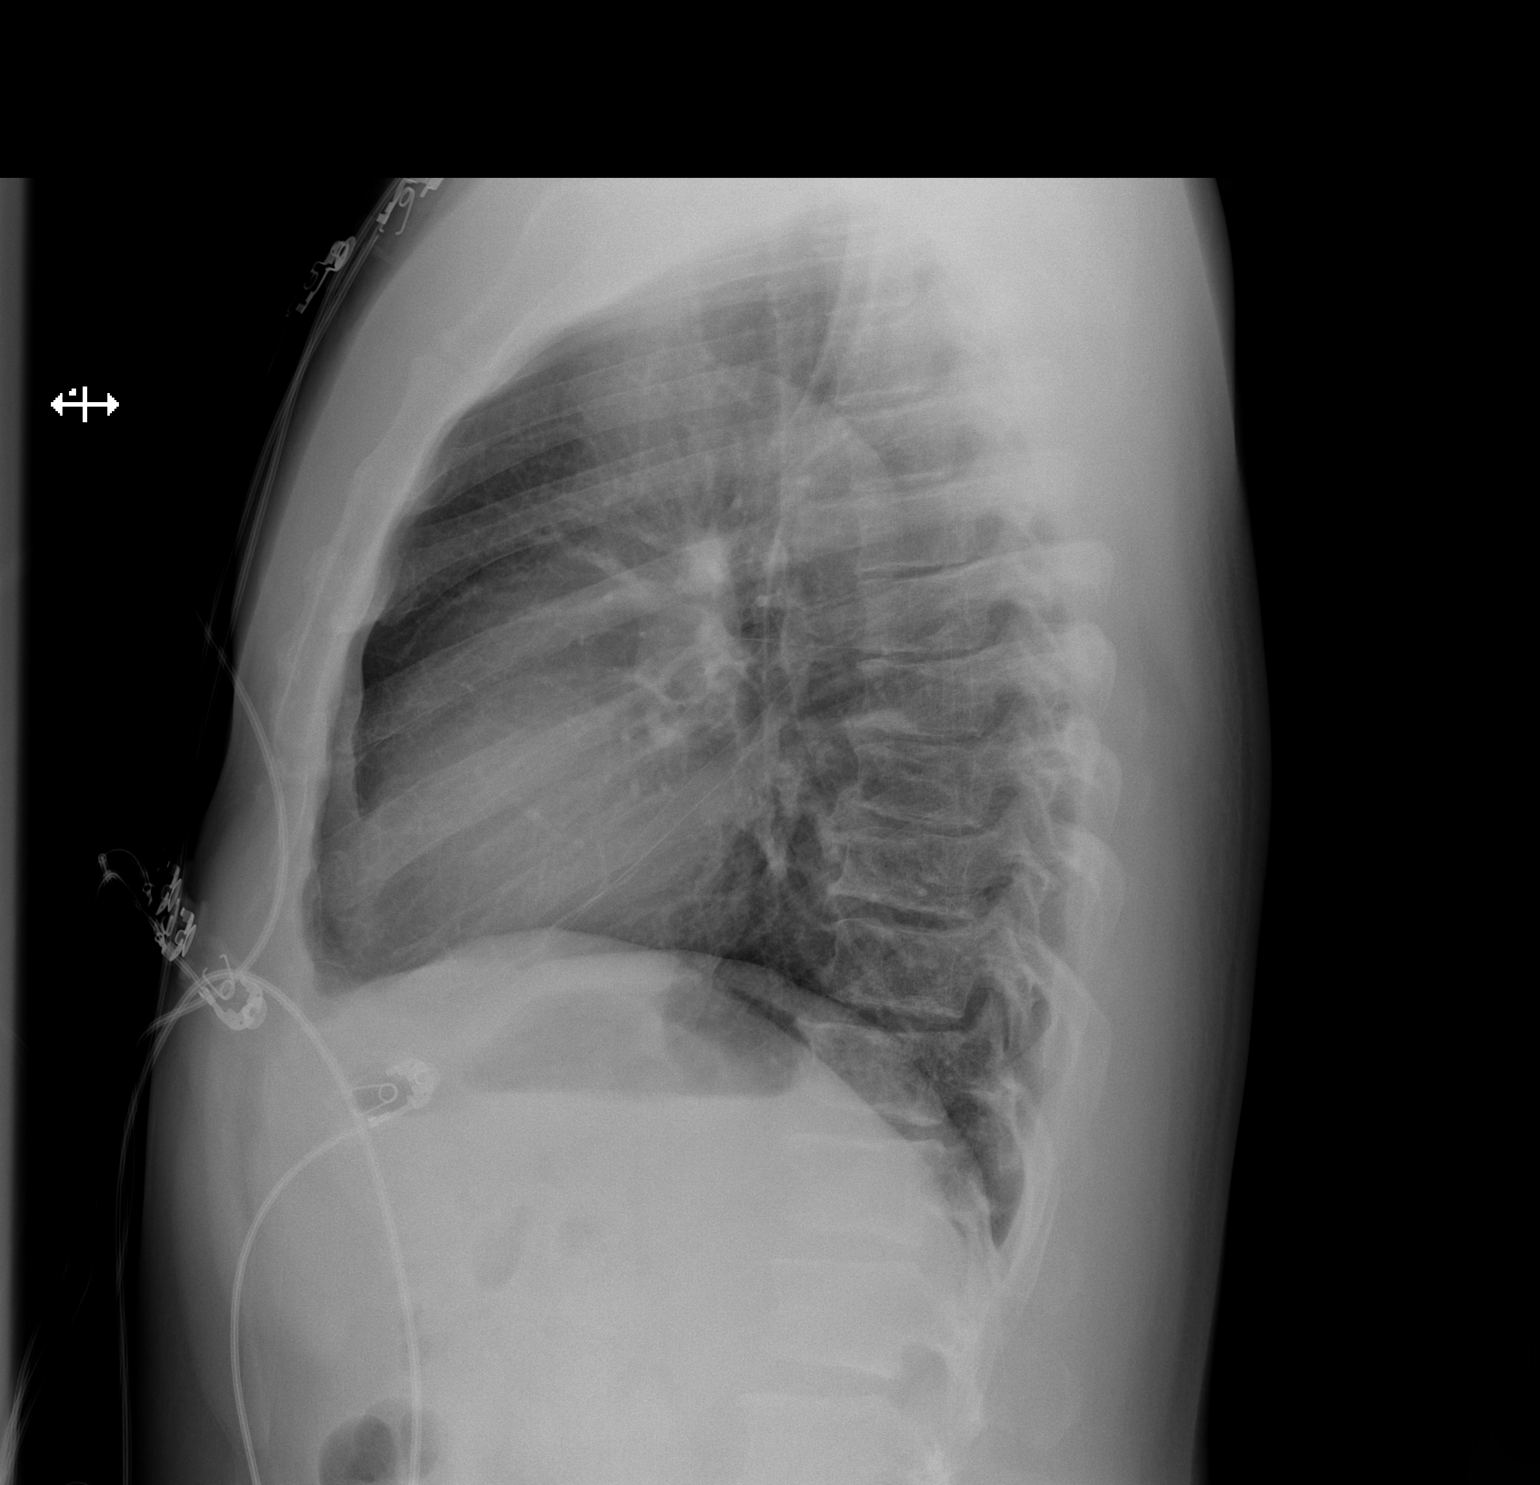

[2 of 2 positions shown; findings below may reference images not displayed]

FINDINGS: Stable lung volumes, somewhat low. Normal cardiac size and
mediastinal contours. Visualized tracheal air column is within
normal limits. Both lungs appear clear. No pneumothorax or pleural
effusion. No pneumoperitoneum. Normal visible bowel gas pattern in
the upper abdomen. No acute osseous abnormality identified.
IMPRESSION: Negative.  No cardiopulmonary abnormality.

## 2020-02-17 ENCOUNTER — Emergency Department (HOSPITAL_COMMUNITY)
Admission: EM | Admit: 2020-02-17 | Discharge: 2020-02-17 | Disposition: A | Discharge status: Home / Self Care | Payer: Self-pay | Attending: Emergency Medicine | Admitting: Emergency Medicine

## 2020-02-17 ENCOUNTER — Other Ambulatory Visit: Payer: Self-pay

## 2020-02-17 ENCOUNTER — Emergency Department (HOSPITAL_COMMUNITY): Payer: Self-pay

## 2020-02-17 ENCOUNTER — Encounter (HOSPITAL_COMMUNITY): Payer: Self-pay | Admitting: Emergency Medicine

## 2020-02-17 DIAGNOSIS — R079 Chest pain, unspecified: Secondary | ICD-10-CM | POA: Insufficient documentation

## 2020-02-17 DIAGNOSIS — Z5321 Procedure and treatment not carried out due to patient leaving prior to being seen by health care provider: Secondary | ICD-10-CM | POA: Insufficient documentation

## 2020-02-17 LAB — BASIC METABOLIC PANEL
Anion gap: 12 (ref 5–15)
BUN: 6 mg/dL (ref 6–20)
CO2: 23 mmol/L (ref 22–32)
Calcium: 9.9 mg/dL (ref 8.9–10.3)
Chloride: 100 mmol/L (ref 98–111)
Creatinine, Ser: 0.82 mg/dL (ref 0.61–1.24)
GFR, Estimated: >60 mL/min (ref >60)
Glucose, Bld: 94 mg/dL (ref 70–99)
Potassium: 3.5 mmol/L (ref 3.5–5.1)
Sodium: 135 mmol/L (ref 135–145)

## 2020-02-17 LAB — CBC
HCT: 40 % (ref 39.0–52.0)
Hemoglobin: 13.7 g/dL (ref 13.0–17.0)
MCH: 31.1 pg (ref 26.0–34.0)
MCHC: 34.3 g/dL (ref 30.0–36.0)
MCV: 90.9 fL (ref 80.0–100.0)
Platelets: 181 10*3/uL (ref 150–400)
RBC: 4.4 MIL/uL (ref 4.22–5.81)
RDW: 13.2 % (ref 11.5–15.5)
WBC: 4.9 10*3/uL (ref 4.0–10.5)
nRBC: 0 % (ref 0.0–0.2)

## 2020-02-17 LAB — TROPONIN I (HIGH SENSITIVITY): Troponin I (High Sensitivity): 3 ng/L (ref <18)

## 2020-02-17 NOTE — ED Notes (Signed)
Pt stated he wasn't going to wait any longer and pt left.

## 2020-02-17 NOTE — ED Triage Notes (Signed)
Patient complains of left sided chest pain that started when he woke up today. Patient states he has been asleep for over 24 hours.

## 2020-02-19 ENCOUNTER — Emergency Department (HOSPITAL_COMMUNITY): Admission: EM | Admit: 2020-02-19 | Discharge: 2020-02-20 | Discharge status: Against Medical Advice | Payer: Self-pay

## 2020-02-19 ENCOUNTER — Other Ambulatory Visit: Payer: Self-pay

## 2020-04-26 ENCOUNTER — Other Ambulatory Visit: Payer: Self-pay

## 2020-04-26 ENCOUNTER — Encounter (HOSPITAL_COMMUNITY): Payer: Self-pay | Admitting: Emergency Medicine

## 2020-04-26 ENCOUNTER — Emergency Department (HOSPITAL_COMMUNITY)
Admission: EM | Admit: 2020-04-26 | Discharge: 2020-04-26 | Disposition: A | Discharge status: Home / Self Care | Payer: Self-pay | Attending: Emergency Medicine | Admitting: Emergency Medicine

## 2020-04-26 DIAGNOSIS — M62830 Muscle spasm of back: Secondary | ICD-10-CM | POA: Insufficient documentation

## 2020-04-26 DIAGNOSIS — M546 Pain in thoracic spine: Secondary | ICD-10-CM | POA: Insufficient documentation

## 2020-04-26 MED ORDER — NAPROXEN 250 MG PO TABS
500.0000 mg | ORAL_TABLET | Freq: Once | ORAL | Status: AC
  Administered 2020-04-26: 500 mg via ORAL
  Filled 2020-04-26: qty 2

## 2020-04-26 MED ORDER — METHOCARBAMOL 500 MG PO TABS
500.0000 mg | ORAL_TABLET | Freq: Once | ORAL | Status: AC
  Administered 2020-04-26: 500 mg via ORAL
  Filled 2020-04-26: qty 1

## 2020-04-26 MED ORDER — NAPROXEN 500 MG PO TABS: 500.0000 mg | ORAL_TABLET | Freq: Two times a day (BID) | ORAL | 0 refills | Status: DC

## 2020-04-26 MED ORDER — METHOCARBAMOL 500 MG PO TABS: 500.0000 mg | ORAL_TABLET | Freq: Two times a day (BID) | ORAL | 0 refills | Status: DC

## 2020-04-26 MED ORDER — LIDOCAINE 5 % EX PTCH
1.0000 | MEDICATED_PATCH | Freq: Once | CUTANEOUS | Status: DC
  Administered 2020-04-26: 1 via TRANSDERMAL
  Filled 2020-04-26: qty 1

## 2020-04-26 NOTE — Discharge Instructions (Addendum)
Suspect the pain in your upper back is due to muscle spasm as I can palpate a knot in your muscle.  Use naproxen twice daily for the next week, you can take Tylenol 1000 mg every 6 hours in addition to this medicine and you can also use prescribed Robaxin (muscle relaxer) twice daily as needed, this medicine can cause drowsiness, do not take before driving.  You can also apply over-the-counter Salonpas lidocaine patches for 12 hours at a time.  You can also do massage or use heat over the area to help reduce spasm.

## 2020-04-26 NOTE — ED Triage Notes (Signed)
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

## 2020-04-26 NOTE — ED Provider Notes (Signed)
[facility] EMERGENCY DEPARTMENT Provider Note   CSN: [id number] Arrival date & time: 04/26/20  1014     History Chief Complaint  Patient presents with  . Back Pain    Brady Martin is a 45 y.o. male.  Brady Martin is a 45 y.o. male with a history of kidney stones, otherwise healthy, who presents to the ED for evaluation of right upper back and shoulder pain for the past 5 days.  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  He reports it feels like a tight knot in his muscle and is worse with movement of his arm.  He reports it radiates up over the shoulder as well.  No anterior chest pain or pain with breathing.  No numbness tingling or weakness in the right arm.  He has not tried anything to treat this pain.  No other aggravating or alleviating factors.  No history of similar.        Past Medical History:  Diagnosis Date  . Kidney stones     Patient Active Problem List   Diagnosis Date Noted  . Well adult exam 12/18/2017  . Renal calculus 12/11/2017    Past Surgical History:  Procedure Laterality Date  . KIDNEY STONE SURGERY    . NO PAST SURGERIES         History reviewed. No pertinent family history.  Social History   Tobacco Use  . Smoking status: Former Games developer  . Smokeless tobacco: Never Used  Vaping Use  . Vaping Use: Never used  Substance Use Topics  . Alcohol use: No  . Drug use: No    Home Medications Prior to Admission medications   Medication Sig Start Date End Date Taking? Authorizing Provider  methocarbamol (ROBAXIN) 500 MG tablet Take 1 tablet (500 mg total) by mouth 2 (two) times daily. 04/26/20  Yes Dartha Lodge, PA-C  naproxen (NAPROSYN) 500 MG tablet Take 1 tablet (500 mg total) by mouth 2 (two) times daily. 04/26/20  Yes Dartha Lodge, PA-C  ibuprofen (ADVIL) 800 MG tablet Take 1 tablet (800 mg total) by mouth 3  (three) times daily. 05/09/19   Triplett, Tammy, PA-C    Allergies    Wheat bran, Eggs or egg-derived products, Milk-related compounds, and Soy allergy  Review of Systems   Review of Systems  Constitutional: Negative for chills and fever.  Respiratory: Negative for cough and shortness of breath.   Cardiovascular: Negative for chest pain.  Musculoskeletal: Positive for arthralgias and back pain. Negative for joint swelling and neck pain.  Skin: Negative for color change and rash.  Neurological: Negative for weakness and numbness.  All other systems reviewed and are negative.   Physical Exam Updated Vital Signs BP (!) 128/95 (BP Location: Right Arm)   Pulse 73   Temp 97.6 F (36.4 C) (Oral)   Resp 16   Ht 5\' 4"  (1.626 m)   Wt 79.4 kg   SpO2 100%   BMI 30.04 kg/m   Physical Exam Vitals and nursing note reviewed.  Constitutional:      General: He is not in acute distress.    Appearance: Normal appearance. He is well-developed and normal weight. He is not ill-appearing or diaphoretic.     Comments: Well-appearing and in no distress   HENT:     Head: Normocephalic and atraumatic.  Eyes:  General:        Right eye: No discharge.        Left eye: No discharge.  Cardiovascular:     Rate and Rhythm: Normal rate and regular rhythm.     Pulses:          Radial pulses are 2+ on the right side and 2+ on the left side.       Dorsalis pedis pulses are 2+ on the right side and 2+ on the left side.       Posterior tibial pulses are 2+ on the right side and 2+ on the left side.     Heart sounds: Normal heart sounds.  Pulmonary:     Effort: Pulmonary effort is normal. No respiratory distress.     Breath sounds: Normal breath sounds.     Comments: Respirations equal and unlabored, patient able to speak in full sentences, lungs clear to auscultation bilaterally  Abdominal:     General: Bowel sounds are normal. There is no distension.     Palpations: Abdomen is soft. There is no  mass.     Tenderness: There is no abdominal tenderness. There is no guarding.     Comments: Abdomen soft, nondistended, nontender to palpation in all quadrants without guarding or peritoneal signs, no CVA tenderness bilaterally  Musculoskeletal:     Cervical back: Neck supple.     Comments: Tenderness to palpation over the right upper back musculature, with palpable spasm and muscle knot, no midline tednerness  Skin:    General: Skin is warm and dry.     Capillary Refill: Capillary refill takes less than 2 seconds.  Neurological:     Mental Status: He is alert and oriented to person, place, and time.     Comments: Speech is clear, able to follow commands CN III-XII intact Normal strength in upper and lower extremities bilaterally including dorsiflexion and plantar flexion, strong and equal grip strength Sensation normal to light and sharp touch Moves extremities without ataxia, coordination intact  Psychiatric:        Mood and Affect: Mood normal.        Behavior: Behavior normal.     ED Results / Procedures / Treatments   Labs (all labs ordered are listed, but only abnormal results are displayed) Labs Reviewed - No data to display  EKG None  Radiology No results found.  Procedures Procedures   Medications Ordered in ED Medications  naproxen (NAPROSYN) tablet 500 mg (500 mg Oral Given 04/26/20 1236)  methocarbamol (ROBAXIN) tablet 500 mg (500 mg Oral Given 04/26/20 1236)    ED Course  I have reviewed the triage vital signs and the nursing notes.  Pertinent labs & imaging results that were available during my care of the patient were reviewed by me and considered in my medical decision making (see chart for details).    MDM Rules/Calculators/A&P                         45 yo male presents with pain in the right upper back musculature present.  On exam patient with focal tenderness and palpable spasm in the upper right-sided thoracic back musculature.  Able to palpate  focal muscle.  No midline tenderness, no neurologic deficits, no associated chest pain or shortness of breath.  On arrival vitals are normal and patient is well-appearing.  Low suspicion for ACS, PE or dissection.  Pain is repeatedly reproducible with palpation.  Do not feel  that imaging is indicated as patient has no associated trauma and no line spinal tenderness.  Discussed doing trigger point injection over muscle not but patient would like to try more conservative measures first.  Treated in the ED with NSAIDs and muscle relaxers as well as lidocaine patch with significant improvement in pain.  We will have him continue the same therapies at home and follow-up with PCP if not improving.  Return precautions discussed.  Discharged home in good condition.  Final Clinical Impression(s) / ED Diagnoses Final diagnoses:  Acute right-sided thoracic back pain  Muscle spasm of back    Rx / DC Orders ED Discharge Orders         Ordered    naproxen (NAPROSYN) 500 MG tablet  2 times daily        04/26/20 1251    methocarbamol (ROBAXIN) 500 MG tablet  2 times daily        04/26/20 1251           Jodi Geralds [place], [place] 04/29/20 0831    Maia Plan, MD 05/01/20 1006

## 2020-07-16 ENCOUNTER — Emergency Department (HOSPITAL_COMMUNITY): Payer: Self-pay

## 2020-07-16 ENCOUNTER — Other Ambulatory Visit: Payer: Self-pay

## 2020-07-16 ENCOUNTER — Emergency Department (HOSPITAL_COMMUNITY)
Admission: EM | Admit: 2020-07-16 | Discharge: 2020-07-17 | Disposition: A | Discharge status: Home / Self Care | Payer: Self-pay | Attending: Emergency Medicine | Admitting: Emergency Medicine

## 2020-07-16 DIAGNOSIS — M5126 Other intervertebral disc displacement, lumbar region: Secondary | ICD-10-CM | POA: Insufficient documentation

## 2020-07-16 DIAGNOSIS — Z87891 Personal history of nicotine dependence: Secondary | ICD-10-CM | POA: Insufficient documentation

## 2020-07-16 DIAGNOSIS — R52 Pain, unspecified: Secondary | ICD-10-CM

## 2020-07-16 DIAGNOSIS — R1032 Left lower quadrant pain: Secondary | ICD-10-CM | POA: Insufficient documentation

## 2020-07-16 LAB — URINALYSIS, ROUTINE W REFLEX MICROSCOPIC
Bilirubin Urine: NEGATIVE
Glucose, UA: NEGATIVE mg/dL
Hgb urine dipstick: NEGATIVE
Ketones, ur: NEGATIVE mg/dL
Leukocytes,Ua: NEGATIVE
Nitrite: NEGATIVE
Protein, ur: NEGATIVE mg/dL
Specific Gravity, Urine: 1.023 (ref 1.005–1.030)
pH: 6 (ref 5.0–8.0)

## 2020-07-16 MED ORDER — IOHEXOL 300 MG/ML  SOLN
100.0000 mL | Freq: Once | INTRAMUSCULAR | Status: AC | PRN
  Administered 2020-07-17: 100 mL via INTRAVENOUS

## 2020-07-16 MED ORDER — HYDROCODONE-ACETAMINOPHEN 5-325 MG PO TABS
1.0000 | ORAL_TABLET | Freq: Once | ORAL | Status: AC
  Administered 2020-07-16: 1 via ORAL
  Filled 2020-07-16: qty 1

## 2020-07-16 NOTE — ED Provider Notes (Signed)
[facility] EMERGENCY DEPARTMENT Provider Note   CSN: [id number] Arrival date & time: 07/16/20  1710     History Chief Complaint  Patient presents with   Abdominal Pain   Groin Pain    Brady Martin is a 45 y.o. male with a history significant for kidney stones presenting for evaluation of low back pain which radiates into his left groin region.  He states this pain is very different from a kidney stone and is related to a lifting injury that occurred at work almost 2 weeks ago.  He describes that he was helping to lift a 500 pound beam with 3 other people when the 2 people at the back of the beam dropped to their portion.  Patient describes immediate pain in his left lower back which he has treated with rest, ibuprofen and Aleve without any improvement.  Over the past 5 days he has had escalation of pain which now radiates into his left groin and upper thigh region.  His pain is better at rest, but becomes severe with standing and with flexing his left leg.  He denies inguinal or scrotal swelling, denies dysuria.  No hematuria.  He has found no alleviators for symptoms except for rest.  No weakness or numbness in his legs, no urinary or fecal incontinence or retention.  The history is provided by the patient and the spouse.  Groin Pain Associated symptoms include abdominal pain. Pertinent negatives include no chest pain and no shortness of breath.      Past Medical History:  Diagnosis Date   Kidney stones     Patient Active Problem List   Diagnosis Date Noted   Well adult exam 12/18/2017   Renal calculus 12/11/2017    Past Surgical History:  Procedure Laterality Date   KIDNEY STONE SURGERY     NO PAST SURGERIES         No family history on file.  Social History   Tobacco Use   Smoking status: Former    Pack years: 0.00   Smokeless tobacco: Never  Vaping Use   Vaping Use: Never used  Substance Use Topics   Alcohol use: No   Drug use: No    Home  Medications Prior to Admission medications   Medication Sig Start Date End Date Taking? Authorizing Provider  ibuprofen (ADVIL) 800 MG tablet Take 1 tablet (800 mg total) by mouth 3 (three) times daily. 05/09/19   Triplett, Tammy, PA-C  methocarbamol (ROBAXIN) 500 MG tablet Take 1 tablet (500 mg total) by mouth 2 (two) times daily. 04/26/20   Dartha Lodge, PA-C  naproxen (NAPROSYN) 500 MG tablet Take 1 tablet (500 mg total) by mouth 2 (two) times daily. 04/26/20   Dartha Lodge, PA-C    Allergies    Wheat bran, Eggs or egg-derived products, Milk-related compounds, and Soy allergy  Review of Systems   Review of Systems  Constitutional:  Negative for fever.  Respiratory:  Negative for shortness of breath.   Cardiovascular:  Negative for chest pain and leg swelling.  Gastrointestinal:  Positive for abdominal pain. Negative for abdominal distention, constipation, nausea and vomiting.  Genitourinary:  Negative for difficulty urinating, dysuria, flank pain, frequency, hematuria, penile discharge, penile pain, scrotal swelling and urgency.  Musculoskeletal:  Positive for back pain. Negative for gait problem and joint swelling.  Skin:  Negative for rash.  Neurological:  Negative for weakness and numbness.   Physical Exam Updated Vital Signs BP (!) 138/99   Pulse  70   Temp 98.8 F (37.1 C) (Oral)   Resp 16   Ht 5\' 4"  (1.626 m)   Wt 77.1 kg   SpO2 99%   BMI 29.18 kg/m   Physical Exam Vitals and nursing note reviewed. Exam conducted with a chaperone present.  Constitutional:      Appearance: He is well-developed.  HENT:     Head: Normocephalic.  Eyes:     Conjunctiva/sclera: Conjunctivae normal.  Cardiovascular:     Rate and Rhythm: Normal rate.  Pulmonary:     Effort: Pulmonary effort is normal.  Abdominal:     General: Bowel sounds are normal. There is no distension.     Palpations: Abdomen is soft. There is no mass.     Tenderness: There is abdominal tenderness in the left  lower quadrant.     Hernia: There is no hernia in the left inguinal area or left femoral area.  Genitourinary:    Testes: Cremasteric reflex is present.        Left: Mass, tenderness or swelling not present.  Musculoskeletal:        General: Normal range of motion.     Cervical back: Normal range of motion and neck supple.     Lumbar back: Tenderness present. No swelling, edema or spasms.  Skin:    General: Skin is warm and dry.  Neurological:     Mental Status: He is alert.     Sensory: No sensory deficit.     Motor: No tremor or atrophy.     Gait: Gait normal.     Comments: Unable to assess for equal strength in hip and knee flex/ext secondary to pain with these movements.  Ankle flexion and extension intact.    ED Results / Procedures / Treatments   Labs (all labs ordered are listed, but only abnormal results are displayed) Labs Reviewed  URINALYSIS, ROUTINE W REFLEX MICROSCOPIC  BASIC METABOLIC PANEL  CBC    EKG None  Radiology No results found.  Procedures Procedures   Medications Ordered in ED Medications  HYDROcodone-acetaminophen (NORCO/VICODIN) 5-325 MG per tablet 1 tablet (has no administration in time range)    ED Course  I have reviewed the triage vital signs and the nursing notes.  Pertinent labs & imaging results that were available during my care of the patient were reviewed by me and considered in my medical decision making (see chart for details).    MDM Rules/Calculators/A&P                          Patient with significant low back pain and left lower pelvis and left upper inguinal and thigh pain, raising concerns for deep quadricep muscle tear/hematoma from this trauma.  CT scan abdomen and pelvis with lumbar bone imaging ordered.  Discussed with Dr.  who assumes patient care. Final Clinical Impression(s) / ED Diagnoses Final diagnoses:  Pain    Rx / DC Orders ED Discharge Orders     None        Pilar Plate 07/16/20  2336    2337, MD 07/17/20 [phone]

## 2020-07-16 NOTE — ED Triage Notes (Signed)
Pain in left groin area

## 2020-07-16 NOTE — ED Provider Notes (Signed)
  Provider Note MRN:  [id number]  Arrival date & time: 07/17/20    ED Course and Medical Decision Making  Assumed care from PA [facility] at shift change.  Back pain, groin pain after trauma, heavy weight.  CT to evaluate for lumbar injury, hernia.  CT is without abdominal hernia, there is evidence of disc herniation with effect on left S1 nerve root, which would definitely correlate with patient's symptoms.  On my assessment he is doing well, he denies any numbness or weakness to the legs, no bowel or bladder dysfunction, he has nothing to suggest myelopathy and so there is no indication for emergent MRI.  Patient is appropriate for conservative management at home, follow-up with spine experts if not improving.  Procedures  Final Clinical Impressions(s) / ED Diagnoses     ICD-10-CM   1. Lumbar herniated disc  M51.26     2. Pain  R52 CT L-SPINE NO CHARGE    CT L-SPINE NO CHARGE      ED Discharge Orders          Ordered    predniSONE (DELTASONE) 20 MG tablet  Daily        07/17/20 0127    methocarbamol (ROBAXIN) 500 MG tablet  2 times daily        07/17/20 0127              Discharge Instructions      You were evaluated in the Emergency Department and after careful evaluation, we did not find any emergent condition requiring admission or further testing in the hospital.  Your exam/testing today is overall reassuring.  Symptoms seem to be due to a herniated disc and a pinched nerve.  You are given your first dose of steroid medication here in the emergency department.  Please take the prednisone steroid medication at home for the next 4 days.  Recommend Tylenol 1000 mg every 4-6 hours for pain.  For more significant pain you can use the Robaxin muscle relaxer.  If not fully recovered in 2 weeks, recommend follow-up with the spine experts.  Please return to the Emergency Department if you experience any worsening of your condition.   Thank you for allowing [place] to be a part of  your care.      Elmer Sow. Pilar Plate, MD [facility] Emergency Medicine [facility] [email or web address]    Sabas Sous, MD 07/17/20 [phone]

## 2020-07-16 NOTE — ED Provider Notes (Signed)
Emergency Medicine Provider Triage Evaluation Note  Brady Martin , a 45 y.o. male  was evaluated in triage.  Pt complains of presents with back pain x5 days, states it radiates into his groin, worsened when he stands up or moves.  Denies urinary symptoms, abdominal pain..  Review of Systems  Positive: Back pain and left buttocks pain Negative: Denies urinary symptoms, abdominal pain  Physical Exam  BP (!) 147/104 (BP Location: Left Arm)   Pulse 85   Temp 98.8 F (37.1 C) (Oral)   Resp 16   Ht 5\' 4"  (1.626 m)   Wt 77.1 kg   SpO2 100%   BMI 29.18 kg/m  Gen:   Awake, no distress   Resp:  Normal effort  MSK:   Moves extremities without difficulty  Other:    Medical Decision Making  Medically screening exam initiated at 7:21 PM.  Appropriate orders placed.  [facility] was informed that the remainder of the evaluation will be completed by another provider, this initial triage assessment does not replace that evaluation, and the importance of remaining in the ED until their evaluation is complete.  Patient presents with back pain x5 days, lab work has been ordered, patient will need further work-up here in the emergency department.   [employer], PA-C 07/16/20 07/18/20, MD 07/19/20 [phone]

## 2020-07-17 LAB — COMPREHENSIVE METABOLIC PANEL
ALT: 18 U/L (ref 0–44)
AST: 26 U/L (ref 15–41)
Albumin: 3.8 g/dL (ref 3.5–5.0)
Alkaline Phosphatase: 56 U/L (ref 38–126)
Anion gap: 6 (ref 5–15)
BUN: 8 mg/dL (ref 6–20)
CO2: 27 mmol/L (ref 22–32)
Calcium: 9.2 mg/dL (ref 8.9–10.3)
Chloride: 105 mmol/L (ref 98–111)
Creatinine, Ser: 0.73 mg/dL (ref 0.61–1.24)
GFR, Estimated: >60 mL/min (ref >60)
Glucose, Bld: 137 mg/dL — ABNORMAL HIGH (ref 70–99)
Potassium: 3.6 mmol/L (ref 3.5–5.1)
Sodium: 138 mmol/L (ref 135–145)
Total Bilirubin: 0.4 mg/dL (ref 0.3–1.2)
Total Protein: 7.2 g/dL (ref 6.5–8.1)

## 2020-07-17 LAB — CBC
HCT: 37 % — ABNORMAL LOW (ref 39.0–52.0)
Hemoglobin: 12.2 g/dL — ABNORMAL LOW (ref 13.0–17.0)
MCH: 30.9 pg (ref 26.0–34.0)
MCHC: 33 g/dL (ref 30.0–36.0)
MCV: 93.7 fL (ref 80.0–100.0)
Platelets: 186 10*3/uL (ref 150–400)
RBC: 3.95 MIL/uL — ABNORMAL LOW (ref 4.22–5.81)
RDW: 13.2 % (ref 11.5–15.5)
WBC: 4.6 10*3/uL (ref 4.0–10.5)
nRBC: 0 % (ref 0.0–0.2)

## 2020-07-17 MED ORDER — METHOCARBAMOL 500 MG PO TABS: 500.0000 mg | ORAL_TABLET | Freq: Two times a day (BID) | ORAL | 0 refills | Status: DC

## 2020-07-17 MED ORDER — PREDNISONE 20 MG PO TABS: 40.0000 mg | ORAL_TABLET | Freq: Every day | ORAL | 0 refills | Status: AC

## 2020-07-17 MED ORDER — PREDNISONE 50 MG PO TABS
60.0000 mg | ORAL_TABLET | Freq: Once | ORAL | Status: AC
  Administered 2020-07-17: 60 mg via ORAL
  Filled 2020-07-17: qty 1

## 2020-07-17 NOTE — Discharge Instructions (Addendum)
You were evaluated in the Emergency Department and after careful evaluation, we did not find any emergent condition requiring admission or further testing in the hospital.  Your exam/testing today is overall reassuring.  Symptoms seem to be due to a herniated disc and a pinched nerve.  You are given your first dose of steroid medication here in the emergency department.  Please take the prednisone steroid medication at home for the next 4 days.  Recommend Tylenol 1000 mg every 4-6 hours for pain.  For more significant pain you can use the Robaxin muscle relaxer.  If not fully recovered in 2 weeks, recommend follow-up with the spine experts.  Please return to the Emergency Department if you experience any worsening of your condition.   Thank you for allowing [place] to be a part of your care.

## 2021-04-27 ENCOUNTER — Other Ambulatory Visit: Payer: Self-pay

## 2021-04-27 ENCOUNTER — Emergency Department (HOSPITAL_COMMUNITY): Payer: Commercial Managed Care - HMO

## 2021-04-27 ENCOUNTER — Emergency Department (HOSPITAL_COMMUNITY)
Admission: EM | Admit: 2021-04-27 | Discharge: 2021-04-27 | Disposition: A | Discharge status: Home / Self Care | Payer: Commercial Managed Care - HMO | Attending: Emergency Medicine | Admitting: Emergency Medicine

## 2021-04-27 ENCOUNTER — Encounter (HOSPITAL_COMMUNITY): Payer: Self-pay | Admitting: *Deleted

## 2021-04-27 DIAGNOSIS — W228XXA Striking against or struck by other objects, initial encounter: Secondary | ICD-10-CM | POA: Insufficient documentation

## 2021-04-27 DIAGNOSIS — Y990 Civilian activity done for income or pay: Secondary | ICD-10-CM | POA: Diagnosis not present

## 2021-04-27 DIAGNOSIS — S62306A Unspecified fracture of fifth metacarpal bone, right hand, initial encounter for closed fracture: Secondary | ICD-10-CM

## 2021-04-27 DIAGNOSIS — S62398A Other fracture of other metacarpal bone, initial encounter for closed fracture: Secondary | ICD-10-CM | POA: Diagnosis not present

## 2021-04-27 DIAGNOSIS — M79641 Pain in right hand: Secondary | ICD-10-CM | POA: Diagnosis not present

## 2021-04-27 DIAGNOSIS — S6991XA Unspecified injury of right wrist, hand and finger(s), initial encounter: Secondary | ICD-10-CM | POA: Diagnosis present

## 2021-04-27 MED ORDER — IBUPROFEN 800 MG PO TABS: 800.0000 mg | ORAL_TABLET | Freq: Three times a day (TID) | ORAL | 0 refills | Status: DC

## 2021-04-27 MED ORDER — IBUPROFEN 800 MG PO TABS
800.0000 mg | ORAL_TABLET | Freq: Once | ORAL | Status: AC
  Administered 2021-04-27: 800 mg via ORAL
  Filled 2021-04-27: qty 1

## 2021-04-27 NOTE — ED Provider Notes (Signed)
?[facility] ?Provider Note ? ? ?CSN: [id number] ?Arrival date & time: 04/27/21  1435 ? ?  ? ?History ? ?Chief Complaint  ?Patient presents with  ? Hand Pain  ? ? ?Brady Martin is a 46 y.o. male. ? ?The history is provided by the patient.  ?Hand Pain ? ? ?Patient is a 46 year old male presenting today with right hand pain.  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  It is all the time, feels like an aching pain.  Is worse on the pinky side of his hand, initially felt swollen about a month ago that is improved with the pain is still present.  The pain is worse with flexion extension of the hand, is not having any fevers.  Has not tried anything for the pain. ? ?Home Medications ?Prior to Admission medications   ?Medication Sig Start Date End Date Taking? Authorizing Provider  ?ibuprofen (ADVIL) 800 MG tablet Take 1 tablet (800 mg total) by mouth 3 (three) times daily. 05/09/19   Triplett, Tammy, PA-C  ?methocarbamol (ROBAXIN) 500 MG tablet Take 1 tablet (500 mg total) by mouth 2 (two) times daily. 07/17/20   Maudie Flakes, MD  ?naproxen (NAPROSYN) 500 MG tablet Take 1 tablet (500 mg total) by mouth 2 (two) times daily. 04/26/20   Jacqlyn Larsen, PA-C  ?   ? ?Allergies    ?Wheat bran, Eggs or egg-derived products, Milk-related compounds, and Soy allergy   ? ?Review of Systems   ?Review of Systems ? ?Physical Exam ?Updated Vital Signs ?BP (!) 160/106 (BP Location: Right Arm)   Pulse 77   Temp 97.8 ?F (36.6 ?C) (Oral)   Resp 18   Ht 5\' 4"  (1.626 m)   Wt 78.2 kg   SpO2 100%   BMI 29.60 kg/m?  ?Physical Exam ?Vitals and nursing note reviewed. Exam conducted with a chaperone present.  ?Constitutional:   ?   General: He is not in acute distress. ?   Appearance: Normal appearance.  ?HENT:  ?   Head: Normocephalic and atraumatic.  ?Eyes:  ?   General: No scleral icterus. ?   Extraocular Movements: Extraocular movements  intact.  ?   Pupils: Pupils are equal, round, and reactive to light.  ?Cardiovascular:  ?   Pulses: Normal pulses.  ?Musculoskeletal:     ?   General: Tenderness present.  ?   Comments: Slight decreased ROM to the right fifth finger.  There is some localized swelling but no crepitus.  ?Skin: ?   Capillary Refill: Capillary refill takes less than 2 seconds.  ?   Coloration: Skin is not jaundiced.  ?Neurological:  ?   Mental Status: He is alert. Mental status is at baseline.  ?   Coordination: Coordination normal.  ? ? ?ED Results / Procedures / Treatments   ?Labs ?(all labs ordered are listed, but only abnormal results are displayed) ?Labs Reviewed - No data to display ? ?EKG ?None ? ?Radiology ?DG Hand Complete Right ? ?Result Date: 04/27/2021 ?CLINICAL DATA:  Hand pain. EXAM: RIGHT HAND - COMPLETE 3+ VIEW COMPARISON:  None. FINDINGS: Transverse linear lucency and deformity through the fifth metacarpal head. There is some surrounding periosteal reaction in this region. Findings may represent acute or subacute nondisplaced fracture. There is no evidence for dislocation. 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 There is radiocarpal joint space narrowing compatible with degenerative  change. Degenerative changes affect the interphalangeal joints diffusely most significant in the second and fourth distal interphalangeal joints. The soft tissues are within normal limits. IMPRESSION: 1. Acute or subacute nondisplaced fracture of the fifth metacarpal head. Please correlate clinically. 2. Degenerative changes of the hand and wrist. Electronically Signed   By: Ronney Asters M.D.   On: 04/27/2021 15:21   ? ?Procedures ?Procedures  ? ? ?Medications Ordered in ED ?Medications  ?ibuprofen (ADVIL) tablet 800 mg (has no administration in time range)  ? ? ?ED Course/ Medical Decision Making/ A&P ?  ?                        ?Medical Decision Making ?Amount and/or Complexity of Data Reviewed ?Radiology:  ordered. ? ?Risk ?Prescription drug management. ? ? ?This is a 46 year old male presenting with right hand pain.  Differential diagnosis includes but is not limited to cellulitis, fracture, dislocation, septic joint. ? ?Patient is neurovascularly intact on physical exam.  Brisk cap refill, good range of motion although there is a slight deficit to the fifth metacarpal and is associated with pain.  X-ray ordered. ? ?I viewed the x-ray of his hand and per my interpretation patient apparently has a right fifth metacarpal fracture.  Likely subacute given his the pain started a month ago, will proceed with ulnar gutter splint and have him follow-up with orthopedics. ? ? ? ? ? ? ? ?Final Clinical Impression(s) / ED Diagnoses ?Final diagnoses:  ?None  ? ? ?Rx / DC Orders ?ED Discharge Orders   ? ? None  ? ?  ? ? ?  ?Sherrill Raring, PA-C ?04/27/21 1533 ? ?  ?Jeanell Sparrow, DO ?04/29/21 0115 ? ?

## 2021-04-27 NOTE — ED Triage Notes (Signed)
Pt with right hand pain for a month.  Pt builds pools using tools, pt states he may have hit hand against a tool at some point but doesn't remember.  Pt dominate hand is the right.  ?

## 2021-04-27 NOTE — ED Notes (Signed)
Sherrill Raring, PA--C assessed pt's ulnar gutter splint before discharge. Pt denies loss of sensation after casting and states he is able to feel his fingers  ?

## 2021-04-27 NOTE — Discharge Instructions (Signed)
Wear the splint until you see orthopedics.  Start taking ibuprofen 800 mg 3 times daily for the next week.  Follow-up with orthopedic surgery, you will need to call them Monday morning to set up an appointment for this week.  Information provided above. ?

## 2021-11-02 ENCOUNTER — Emergency Department (HOSPITAL_BASED_OUTPATIENT_CLINIC_OR_DEPARTMENT_OTHER): Payer: Commercial Managed Care - HMO

## 2021-11-02 ENCOUNTER — Emergency Department (HOSPITAL_BASED_OUTPATIENT_CLINIC_OR_DEPARTMENT_OTHER)
Admission: EM | Admit: 2021-11-02 | Discharge: 2021-11-02 | Disposition: A | Discharge status: Home / Self Care | Payer: Commercial Managed Care - HMO | Attending: Emergency Medicine | Admitting: Emergency Medicine

## 2021-11-02 ENCOUNTER — Encounter (HOSPITAL_BASED_OUTPATIENT_CLINIC_OR_DEPARTMENT_OTHER): Payer: Self-pay

## 2021-11-02 ENCOUNTER — Other Ambulatory Visit: Payer: Self-pay

## 2021-11-02 DIAGNOSIS — N200 Calculus of kidney: Secondary | ICD-10-CM

## 2021-11-02 DIAGNOSIS — Z87891 Personal history of nicotine dependence: Secondary | ICD-10-CM | POA: Diagnosis not present

## 2021-11-02 DIAGNOSIS — Z79899 Other long term (current) drug therapy: Secondary | ICD-10-CM | POA: Insufficient documentation

## 2021-11-02 DIAGNOSIS — R109 Unspecified abdominal pain: Secondary | ICD-10-CM | POA: Diagnosis present

## 2021-11-02 DIAGNOSIS — N132 Hydronephrosis with renal and ureteral calculous obstruction: Secondary | ICD-10-CM | POA: Diagnosis not present

## 2021-11-02 LAB — CBC WITH DIFFERENTIAL/PLATELET
Abs Immature Granulocytes: 0.01 10*3/uL (ref 0.00–0.07)
Basophils Absolute: 0 10*3/uL (ref 0.0–0.1)
Basophils Relative: 0 %
Eosinophils Absolute: 0.1 10*3/uL (ref 0.0–0.5)
Eosinophils Relative: 2 %
HCT: 38.4 % — ABNORMAL LOW (ref 39.0–52.0)
Hemoglobin: 12.7 g/dL — ABNORMAL LOW (ref 13.0–17.0)
Immature Granulocytes: 0 %
Lymphocytes Relative: 32 %
Lymphs Abs: 1.6 10*3/uL (ref 0.7–4.0)
MCH: 31.1 pg (ref 26.0–34.0)
MCHC: 33.1 g/dL (ref 30.0–36.0)
MCV: 93.9 fL (ref 80.0–100.0)
Monocytes Absolute: 0.4 10*3/uL (ref 0.1–1.0)
Monocytes Relative: 8 %
Neutro Abs: 2.9 10*3/uL (ref 1.7–7.7)
Neutrophils Relative %: 58 %
Platelets: 159 10*3/uL (ref 150–400)
RBC: 4.09 MIL/uL — ABNORMAL LOW (ref 4.22–5.81)
RDW: 13.9 % (ref 11.5–15.5)
WBC: 5 10*3/uL (ref 4.0–10.5)
nRBC: 0 % (ref 0.0–0.2)

## 2021-11-02 LAB — COMPREHENSIVE METABOLIC PANEL
ALT: 15 U/L (ref 0–44)
AST: 23 U/L (ref 15–41)
Albumin: 3.8 g/dL (ref 3.5–5.0)
Alkaline Phosphatase: 50 U/L (ref 38–126)
Anion gap: 7 (ref 5–15)
BUN: 13 mg/dL (ref 6–20)
CO2: 29 mmol/L (ref 22–32)
Calcium: 8.9 mg/dL (ref 8.9–10.3)
Chloride: 104 mmol/L (ref 98–111)
Creatinine, Ser: 1.08 mg/dL (ref 0.61–1.24)
GFR, Estimated: >60 mL/min (ref >60)
Glucose, Bld: 91 mg/dL (ref 70–99)
Potassium: 4 mmol/L (ref 3.5–5.1)
Sodium: 140 mmol/L (ref 135–145)
Total Bilirubin: 0.4 mg/dL (ref 0.3–1.2)
Total Protein: 6.8 g/dL (ref 6.5–8.1)

## 2021-11-02 LAB — URINALYSIS, ROUTINE W REFLEX MICROSCOPIC
Bilirubin Urine: NEGATIVE
Glucose, UA: NEGATIVE mg/dL
Ketones, ur: NEGATIVE mg/dL
Leukocytes,Ua: NEGATIVE
Nitrite: NEGATIVE
Protein, ur: 30 mg/dL — AB
RBC / HPF: >50 RBC/hpf — ABNORMAL HIGH (ref 0–5)
Specific Gravity, Urine: 1.028 (ref 1.005–1.030)
pH: 5.5 (ref 5.0–8.0)

## 2021-11-02 LAB — LACTIC ACID, PLASMA: Lactic Acid, Venous: 0.7 mmol/L (ref 0.5–1.9)

## 2021-11-02 MED ORDER — ONDANSETRON HCL 4 MG PO TABS: 4.0000 mg | ORAL_TABLET | Freq: Three times a day (TID) | ORAL | 0 refills | Status: DC | PRN

## 2021-11-02 MED ORDER — SODIUM CHLORIDE 0.9 % IV BOLUS
1000.0000 mL | Freq: Once | INTRAVENOUS | Status: AC
  Administered 2021-11-02: 1000 mL via INTRAVENOUS

## 2021-11-02 MED ORDER — HYDROMORPHONE HCL 1 MG/ML IJ SOLN
1.0000 mg | Freq: Once | INTRAMUSCULAR | Status: AC
  Administered 2021-11-02: 1 mg via INTRAVENOUS
  Filled 2021-11-02: qty 1

## 2021-11-02 MED ORDER — KETOROLAC TROMETHAMINE 15 MG/ML IJ SOLN
15.0000 mg | Freq: Once | INTRAMUSCULAR | Status: AC
  Administered 2021-11-02: 15 mg via INTRAVENOUS
  Filled 2021-11-02: qty 1

## 2021-11-02 MED ORDER — ONDANSETRON HCL 4 MG/2ML IJ SOLN
4.0000 mg | Freq: Once | INTRAMUSCULAR | Status: AC
  Administered 2021-11-02: 4 mg via INTRAVENOUS
  Filled 2021-11-02: qty 2

## 2021-11-02 MED ORDER — TAMSULOSIN HCL 0.4 MG PO CAPS: 0.4000 mg | ORAL_CAPSULE | Freq: Every day | ORAL | 0 refills | Status: AC

## 2021-11-02 MED ORDER — TAMSULOSIN HCL 0.4 MG PO CAPS
0.4000 mg | ORAL_CAPSULE | Freq: Once | ORAL | Status: AC
  Administered 2021-11-02: 0.4 mg via ORAL
  Filled 2021-11-02: qty 1

## 2021-11-02 MED ORDER — OXYCODONE-ACETAMINOPHEN 5-325 MG PO TABS: 1.0000 | ORAL_TABLET | ORAL | 0 refills | Status: DC | PRN

## 2021-11-02 NOTE — ED Notes (Signed)
Pt. States he is still unable to urinate at this time. Provided with urinal and instructions to use call bell when able to produce.

## 2021-11-02 NOTE — ED Triage Notes (Signed)
Sudden right sided flank pain beginning 1 hour prior to arrival while asleep. Sts he has had 4 previous kidney stones.   Unable to provide urine specimen.

## 2021-11-02 NOTE — ED Provider Notes (Signed)
[facility] Provider Note: Lowella Dell, MD, FACEP  CSN: [id number] MRN: [id number] ARRIVAL: 11/02/21 at 0323 ROOM: DB015/DB015   CHIEF COMPLAINT  Flank Pain   HISTORY OF PRESENT ILLNESS  11/02/21 3:39 AM Brady Martin is a 46 y.o. male who had the sudden onset of right-sided flank pain about 1 hour prior to arrival.  He has had 4 previous kidney stones and this feels the same.  He is having difficulty urinating at the present time.  He rates the pain as a 10 out of 10.  It is not worse with movement.  He has had associated nausea and vomiting and awakened clammy.   Past Medical History:  Diagnosis Date   Kidney stones     Past Surgical History:  Procedure Laterality Date   KIDNEY STONE SURGERY      No family history on file.  Social History   Tobacco Use   Smoking status: Former   Smokeless tobacco: Never  Building services engineer Use: Never used  Substance Use Topics   Alcohol use: No   Drug use: No    Prior to Admission medications   Medication Sig Start Date End Date Taking? Authorizing Provider  ondansetron (ZOFRAN) 4 MG tablet Take 1 tablet (4 mg total) by mouth every 8 (eight) hours as needed for nausea or vomiting. 11/02/21  Yes Tegeler, Canary Brim, MD  oxyCODONE-acetaminophen (PERCOCET/ROXICET) 5-325 MG tablet Take 1 tablet by mouth every 4 (four) hours as needed for severe pain. 11/02/21  Yes Tegeler, Canary Brim, MD  tamsulosin (FLOMAX) 0.4 MG CAPS capsule Take 1 capsule (0.4 mg total) by mouth daily for 14 days. 11/02/21 11/16/21 Yes Tegeler, Canary Brim, MD  ibuprofen (ADVIL) 800 MG tablet Take 1 tablet (800 mg total) by mouth 3 (three) times daily. 04/27/21   Theron Arista, PA-C  methocarbamol (ROBAXIN) 500 MG tablet Take 1 tablet (500 mg total) by mouth 2 (two) times daily. 07/17/20   Sabas Sous, MD    Allergies Wheat bran, Eggs or egg-derived products, Milk-related compounds, and Soy allergy   REVIEW OF SYSTEMS  Negative except as  noted here or in the History of Present Illness.   PHYSICAL EXAMINATION  Initial Vital Signs Blood pressure (!) 142/91, pulse 63, resp. rate 18, SpO2 97 %.  Examination General: Well-developed, well-nourished male in no acute distress; appearance consistent with age of record HENT: normocephalic; atraumatic Eyes: Normal appearance Neck: supple Heart: regular rate and rhythm Lungs: clear to auscultation bilaterally Abdomen: soft; nondistended; nontender; bowel sounds present GU: Mild right CVA tenderness Extremities: No deformity; full range of motion; pulses normal Neurologic: Awake, alert and oriented; motor function intact in all extremities and symmetric; no facial droop Skin: Warm and dry Psychiatric: Moaning; grimacing   RESULTS  Summary of this visit's results, reviewed and interpreted by myself:   EKG Interpretation  Date/Time:    Ventricular Rate:    PR Interval:    QRS Duration:   QT Interval:    QTC Calculation:   R Axis:     Text Interpretation:         Laboratory Studies: Results for orders placed or performed during the hospital encounter of 11/02/21 (from the past 24 hour(s))  Urinalysis, Routine w reflex microscopic Urine, Unspecified Source     Status: Abnormal   Collection Time: 11/02/21  8:38 AM  Result Value Ref Range   Color, Urine YELLOW YELLOW   APPearance CLEAR CLEAR   Specific Gravity, Urine 1.028  1.005 - 1.030   pH 5.5 5.0 - 8.0   Glucose, UA NEGATIVE NEGATIVE mg/dL   Hgb urine dipstick LARGE (A) NEGATIVE   Bilirubin Urine NEGATIVE NEGATIVE   Ketones, ur NEGATIVE NEGATIVE mg/dL   Protein, ur 30 (A) NEGATIVE mg/dL   Nitrite NEGATIVE NEGATIVE   Leukocytes,Ua NEGATIVE NEGATIVE   RBC / HPF >50 (H) 0 - 5 RBC/hpf   WBC, UA 0-5 0 - 5 WBC/hpf   Squamous Epithelial / LPF 0-5 0 - 5   Mucus PRESENT   CBC with Differential     Status: Abnormal   Collection Time: 11/02/21 10:44 AM  Result Value Ref Range   WBC 5.0 4.0 - 10.5 K/uL   RBC 4.09  (L) 4.22 - 5.81 MIL/uL   Hemoglobin 12.7 (L) 13.0 - 17.0 g/dL   HCT 38.4 (L) 39.0 - 52.0 %   MCV 93.9 80.0 - 100.0 fL   MCH 31.1 26.0 - 34.0 pg   MCHC 33.1 30.0 - 36.0 g/dL   RDW 13.9 11.5 - 15.5 %   Platelets 159 150 - 400 K/uL   nRBC 0.0 0.0 - 0.2 %   Neutrophils Relative % 58 %   Neutro Abs 2.9 1.7 - 7.7 K/uL   Lymphocytes Relative 32 %   Lymphs Abs 1.6 0.7 - 4.0 K/uL   Monocytes Relative 8 %   Monocytes Absolute 0.4 0.1 - 1.0 K/uL   Eosinophils Relative 2 %   Eosinophils Absolute 0.1 0.0 - 0.5 K/uL   Basophils Relative 0 %   Basophils Absolute 0.0 0.0 - 0.1 K/uL   Immature Granulocytes 0 %   Abs Immature Granulocytes 0.01 0.00 - 0.07 K/uL  Comprehensive metabolic panel     Status: None   Collection Time: 11/02/21 10:44 AM  Result Value Ref Range   Sodium 140 135 - 145 mmol/L   Potassium 4.0 3.5 - 5.1 mmol/L   Chloride 104 98 - 111 mmol/L   CO2 29 22 - 32 mmol/L   Glucose, Bld 91 70 - 99 mg/dL   BUN 13 6 - 20 mg/dL   Creatinine, Ser 1.08 0.61 - 1.24 mg/dL   Calcium 8.9 8.9 - 10.3 mg/dL   Total Protein 6.8 6.5 - 8.1 g/dL   Albumin 3.8 3.5 - 5.0 g/dL   AST 23 15 - 41 U/L   ALT 15 0 - 44 U/L   Alkaline Phosphatase 50 38 - 126 U/L   Total Bilirubin 0.4 0.3 - 1.2 mg/dL   GFR, Estimated >60 >60 mL/min   Anion gap 7 5 - 15  Lactic acid, plasma     Status: None   Collection Time: 11/02/21 10:44 AM  Result Value Ref Range   Lactic Acid, Venous 0.7 0.5 - 1.9 mmol/L   Imaging Studies: CT Renal Stone Study  Result Date: 11/02/2021 CLINICAL DATA:  Right flank pain EXAM: CT ABDOMEN AND PELVIS WITHOUT CONTRAST TECHNIQUE: Multidetector CT imaging of the abdomen and pelvis was performed following the standard protocol without IV contrast. RADIATION DOSE REDUCTION: This exam was performed according to the departmental dose-optimization program which includes automated exposure control, adjustment of the mA and/or kV according to patient size and/or use of iterative reconstruction  technique. COMPARISON:  07/17/2020 FINDINGS: Lower chest: Lung bases are clear. Hepatobiliary: Unenhanced liver is unremarkable. Gallbladder is normal. No intrahepatic or extrahepatic ductal dilatation. Pancreas: Within normal limits. Spleen: Within normal limits. Adrenals/Urinary Tract: Adrenal glands are within normal limits. Two nonobstructing left lower pole renal calculi  measuring up to 4 mm (series 2/image 38). Right kidney is notable for mild perirenal edema and 2 punctate nonobstructing right lower pole renal calculi measuring up to 2 mm (series 2/image 40). Mild right hydroureteronephrosis. 7 mm mid right ureteral calculus at the level of the sacral ureter (series 2/image 66). Bladder is underdistended. Stomach/Bowel: Stomach is within normal limits. No evidence of bowel obstruction. Appendix is not discretely visualized. No colonic wall thickening or inflammatory changes. Vascular/Lymphatic: No evidence of abdominal aortic aneurysm. No suspicious abdominopelvic lymphadenopathy. Reproductive: Prostate is unremarkable. Other: No abdominopelvic ascites. Musculoskeletal: Degenerative changes of the visualized thoracolumbar spine. IMPRESSION: 7 mm mid right ureteral calculus at the level of the sacral ureter. Associated mild right hydroureteronephrosis. Additional bilateral nonobstructing renal calculi measuring up to 4 mm in the left lower pole. Electronically Signed   By: Charline Bills M.D.   On: 11/02/2021 04:18    ED COURSE and MDM  Nursing notes, initial and subsequent vitals signs, including pulse oximetry, reviewed and interpreted by myself.  Vitals:   11/02/21 1100 11/02/21 1200 11/02/21 1330 11/02/21 1421  BP: 111/78 121/85 125/83   Pulse: 66 65 68   Resp: 15 15 16    Temp:    98.5 F (36.9 C)  TempSrc:    Oral  SpO2: 99% 98% 99%    Medications  ondansetron (ZOFRAN) injection 4 mg (4 mg Intravenous Given 11/02/21 0359)  HYDROmorphone (DILAUDID) injection 1 mg (1 mg Intravenous Given  11/02/21 0400)  sodium chloride 0.9 % bolus 1,000 mL (0 mLs Intravenous Stopped 11/02/21 0532)  ketorolac (TORADOL) 15 MG/ML injection 15 mg (15 mg Intravenous Given 11/02/21 0402)  tamsulosin (FLOMAX) capsule 0.4 mg (0.4 mg Oral Given 11/02/21 0430)  HYDROmorphone (DILAUDID) injection 1 mg (1 mg Intravenous Given 11/02/21 1043)   7:00 AM Patient's pain well controlled.  CT scan shows a 7 mm right ureteral stone consistent with clinical presentation suspicious for ureterolithiasis in a patient with a history of ureterolithiasis.  Awaiting urine specimen.  Signed out to Dr. 01/02/22.   PROCEDURES  Procedures   ED DIAGNOSES     ICD-10-CM   1. Kidney stone  N20.0          Annastyn Silvey, MD 11/02/21 2238

## 2021-11-02 NOTE — Discharge Instructions (Signed)
Your history, exam, work-up today revealed a kidney stone causing your symptoms.  There was no evidence of infection and your labs were otherwise reassuring as we discussed.  I spoke to urology who recommended attempt at outpatient medical expulsion therapy with pain medicine, nausea medicine, and Flomax.  Please take these medicines to help with symptoms and follow-up with them in clinic.  If any symptoms change or worsen acutely, please return to the nearest emergency department.

## 2021-11-02 NOTE — ED Provider Notes (Signed)
7:20 AM Care assumed from Dr. Florina Ou.  At time of transfer care, patient is waiting for results of urinalysis after he was found to have a small kidney stone.  If patient is feeling better, anticipate discharge with outpatient follow-up with urology.  Patient did have some return in pain so we agreed to get some labs.  No evidence of UTI or infected stone.  I spoke to urology who recommended outpatient medical as well as in therapy of pain to get under control.  After more pain medicine, pain is more manageable.  He would like to try going home with pain medicine, nausea medicine, and Flomax.  We will have him follow-up with outpatient urology and patient agrees.  They had no other questions or concerns and patient discharged in stable condition.   Clinical Impression: 1. Kidney stone     Disposition: Discharge  Condition: Good  I have discussed the results, Dx and Tx plan with the pt(& family if present). He/she/they expressed understanding and agree(s) with the plan. Discharge instructions discussed at great length. Strict return precautions discussed and pt &/or family have verbalized understanding of the instructions. No further questions at time of discharge.    New Prescriptions   ONDANSETRON (ZOFRAN) 4 MG TABLET    Take 1 tablet (4 mg total) by mouth every 8 (eight) hours as needed for nausea or vomiting.   OXYCODONE-ACETAMINOPHEN (PERCOCET/ROXICET) 5-325 MG TABLET    Take 1 tablet by mouth every 4 (four) hours as needed for severe pain.   TAMSULOSIN (FLOMAX) 0.4 MG CAPS CAPSULE    Take 1 capsule (0.4 mg total) by mouth daily for 14 days.    Follow Up: [facility] [address] [place] [id number]       Dow Blahnik, Gwenyth Allegra, MD 11/02/21 1400

## 2021-11-02 NOTE — ED Notes (Signed)
He is awake and pleasantly visiting with his girlfriend. He tells me he is utterly unable to produce a urine sample, in spite of having received a liter if IVF and has drank "4 glasses of water".

## 2021-11-03 LAB — URINE CULTURE: Culture: NO GROWTH

## 2022-01-31 ENCOUNTER — Ambulatory Visit (INDEPENDENT_AMBULATORY_CARE_PROVIDER_SITE_OTHER): Payer: Commercial Managed Care - HMO | Admitting: Family Medicine

## 2022-01-31 ENCOUNTER — Ambulatory Visit: Payer: Commercial Managed Care - HMO | Admitting: Family Medicine

## 2022-01-31 ENCOUNTER — Encounter: Payer: Self-pay | Admitting: Family Medicine

## 2022-01-31 VITALS — BP 124/80 | HR 75 | Temp 97.6°F | Ht 63.0 in | Wt 178.4 lb

## 2022-01-31 DIAGNOSIS — M5440 Lumbago with sciatica, unspecified side: Secondary | ICD-10-CM | POA: Diagnosis not present

## 2022-01-31 DIAGNOSIS — M519 Unspecified thoracic, thoracolumbar and lumbosacral intervertebral disc disorder: Secondary | ICD-10-CM

## 2022-01-31 DIAGNOSIS — R5383 Other fatigue: Secondary | ICD-10-CM | POA: Diagnosis not present

## 2022-01-31 DIAGNOSIS — D649 Anemia, unspecified: Secondary | ICD-10-CM

## 2022-01-31 DIAGNOSIS — G548 Other nerve root and plexus disorders: Secondary | ICD-10-CM | POA: Diagnosis not present

## 2022-01-31 DIAGNOSIS — R0683 Snoring: Secondary | ICD-10-CM

## 2022-01-31 DIAGNOSIS — G8929 Other chronic pain: Secondary | ICD-10-CM

## 2022-01-31 MED ORDER — MELOXICAM 15 MG PO TABS: 15.0000 mg | ORAL_TABLET | Freq: Every day | ORAL | 0 refills | Status: DC

## 2022-01-31 NOTE — Progress Notes (Unsigned)
Assessment/Plan:   Problem List Items Addressed This Visit   None      Subjective:  HPI:  Brady Martin is a 47 y.o. male who has Renal calculus and Well adult exam on their problem list..   He  has a past medical history of Kidney stones.Marland Kitchen   He presents with chief complaint of Establish Care (Was treated in UC for lower back pain and prescribed naproxen and norflex. Hx Kidney stones. Constantly fatigued x 2 months. Night sweats. Patient would like to discuss colonoscopy options.) .  Do you Snore Loudly (loud enough to be heard through closed doors or your bed-partner elbows you for snoring at night)? No  Do you often feel Tired, Fatigued, or Sleepy during the daytime (such as falling asleep during driving or talking to someone)? Yes  Has anyone Observed you Stop Breathing or Choking/Gasping during your sleep ? Yes  Do you have or are being treated for High Blood Pressure ? No  Body Mass Index more than 35 kg/m2? Body mass index is 31.6 kg/m.  Age older than 33?  47 y.o.  Neck size large ? (Measured around [place]) shirt collar >=16 inches / >=40cm larger? No  Gender = Male ? male  Score: 3 STOP-BANG Risk 0-2 Low risk for moderate to severe OSA 3-4 Intermediate risk for moderate to severe OSA 5-8 High risk for moderate to severe OSA  Past Surgical History:  Procedure Laterality Date   KIDNEY STONE SURGERY      Outpatient Medications Prior to Visit  Medication Sig Dispense Refill   naproxen (NAPROSYN) 500 MG tablet Take 500 mg by mouth 2 (two) times daily.     ibuprofen (ADVIL) 800 MG tablet Take 1 tablet (800 mg total) by mouth 3 (three) times daily. (Patient not taking: Reported on 01/31/2022) 21 tablet 0   methocarbamol (ROBAXIN) 500 MG tablet Take 1 tablet (500 mg total) by mouth 2 (two) times daily. (Patient not taking: Reported on 01/31/2022) 20 tablet 0   ondansetron (ZOFRAN) 4 MG tablet Take 1 tablet (4 mg total) by mouth every 8 (eight) hours as  needed for nausea or vomiting. (Patient not taking: Reported on 01/31/2022) 12 tablet 0   orphenadrine (NORFLEX) 100 MG tablet Take 100 mg by mouth 2 (two) times daily as needed. (Patient not taking: Reported on 01/31/2022)     oxyCODONE-acetaminophen (PERCOCET/ROXICET) 5-325 MG tablet Take 1 tablet by mouth every 4 (four) hours as needed for severe pain. (Patient not taking: Reported on 01/31/2022) 15 tablet 0   No facility-administered medications prior to visit.    History reviewed. No pertinent family history.  Social History   Socioeconomic History   Marital status: Married    Spouse name: Not on file   Number of children: Not on file   Years of education: Not on file   Highest education level: Not on file  Occupational History   Not on file  Tobacco Use   Smoking status: Former    Passive exposure: Never   Smokeless tobacco: Never  Vaping Use   Vaping Use: Never used  Substance and Sexual Activity   Alcohol use: No   Drug use: No   Sexual activity: Not on file  Other Topics Concern   Not on file  Social History Narrative   Not on file   Social Determinants of Health   Financial Resource Strain: Not on file  Food Insecurity: Not on file  Transportation Needs: Not on file  Physical Activity: Not on file  Stress: Not on file  Social Connections: Not on file  Intimate Partner Violence: Not on file                                                                                                 Objective:  Physical Exam: BP 124/80 (BP Location: Left Arm, Patient Position: Sitting, Cuff Size: Large)   Pulse 75   Temp 97.6 F (36.4 C) (Temporal)   Ht 5\' 3"  (1.6 m)   Wt 178 lb 6.4 oz (80.9 kg)   SpO2 98%   BMI 31.60 kg/m    ***General: No acute distress. Awake and conversant.  Eyes: Normal conjunctiva, anicteric. Round symmetric pupils.  ENT: Hearing grossly intact. No nasal discharge.  Neck: Neck is supple. No masses or thyromegaly.  Respiratory: Respirations are  non-labored. No auditory wheezing.  Skin: Warm. No rashes or ulcers.  Psych: Alert and oriented. Cooperative, Appropriate mood and affect, Normal judgment.  CV: No cyanosis or JVD MSK: Normal ambulation. No clubbing  Neuro: Sensation and CN II-XII grossly normal.        Alesia Banda, MD, MS

## 2022-01-31 NOTE — Patient Instructions (Addendum)
For lower back pain, we are referring to orthopedics, ordering lower back imaging, starting meloxicam. For fatigue, we are ordering a sleep study and obtain blood work.

## 2022-02-01 LAB — COMPREHENSIVE METABOLIC PANEL
ALT: 16 IU/L (ref 0–44)
AST: 22 IU/L (ref 0–40)
Albumin/Globulin Ratio: 1.6 (ref 1.2–2.2)
Albumin: 4.5 g/dL (ref 4.1–5.1)
Alkaline Phosphatase: 68 IU/L (ref 44–121)
BUN/Creatinine Ratio: 12 (ref 9–20)
BUN: 9 mg/dL (ref 6–24)
Bilirubin Total: 0.2 mg/dL (ref 0.0–1.2)
CO2: 24 mmol/L (ref 20–29)
Calcium: 9.6 mg/dL (ref 8.7–10.2)
Chloride: 102 mmol/L (ref 96–106)
Creatinine, Ser: 0.78 mg/dL (ref 0.76–1.27)
Globulin, Total: 2.9 g/dL (ref 1.5–4.5)
Glucose: 85 mg/dL (ref 70–99)
Potassium: 4.4 mmol/L (ref 3.5–5.2)
Sodium: 140 mmol/L (ref 134–144)
Total Protein: 7.4 g/dL (ref 6.0–8.5)
eGFR: 111 mL/min/{1.73_m2} (ref >59)

## 2022-02-01 LAB — URINALYSIS
Bilirubin, UA: NEGATIVE
Glucose, UA: NEGATIVE
Ketones, UA: NEGATIVE
Leukocytes,UA: NEGATIVE
Nitrite, UA: NEGATIVE
Protein,UA: NEGATIVE
RBC, UA: NEGATIVE
Specific Gravity, UA: 1.027 (ref 1.005–1.030)
Urobilinogen, Ur: 1 mg/dL (ref 0.2–1.0)
pH, UA: 7 (ref 5.0–7.5)

## 2022-02-01 LAB — CBC WITH DIFFERENTIAL/PLATELET
Basophils Absolute: 0 10*3/uL (ref 0.0–0.2)
Basos: 1 %
EOS (ABSOLUTE): 0.3 10*3/uL (ref 0.0–0.4)
Eos: 6 %
Hematocrit: 41.5 % (ref 37.5–51.0)
Hemoglobin: 14.4 g/dL (ref 13.0–17.7)
Immature Grans (Abs): 0 10*3/uL (ref 0.0–0.1)
Immature Granulocytes: 0 %
Lymphocytes Absolute: 1.8 10*3/uL (ref 0.7–3.1)
Lymphs: 40 %
MCH: 30.7 pg (ref 26.6–33.0)
MCHC: 34.7 g/dL (ref 31.5–35.7)
MCV: 89 fL (ref 79–97)
Monocytes Absolute: 0.6 10*3/uL (ref 0.1–0.9)
Monocytes: 13 %
Neutrophils Absolute: 1.7 10*3/uL (ref 1.4–7.0)
Neutrophils: 40 %
Platelets: 223 10*3/uL (ref 150–450)
RBC: 4.69 x10E6/uL (ref 4.14–5.80)
RDW: 12.8 % (ref 11.6–15.4)
WBC: 4.3 10*3/uL (ref 3.4–10.8)

## 2022-02-01 LAB — SEDIMENTATION RATE: Sed Rate: 44 mm/hr — ABNORMAL HIGH (ref 0–15)

## 2022-02-01 LAB — TSH: TSH: 1.49 u[IU]/mL (ref 0.450–4.500)

## 2022-02-01 LAB — ANA W/REFLEX: Anti Nuclear Antibody (ANA): NEGATIVE

## 2022-02-05 DIAGNOSIS — G8929 Other chronic pain: Secondary | ICD-10-CM | POA: Insufficient documentation

## 2022-02-05 DIAGNOSIS — R5383 Other fatigue: Secondary | ICD-10-CM | POA: Insufficient documentation

## 2022-02-05 NOTE — Assessment & Plan Note (Signed)
The patient has chronic lower back pain likely attributable to a history of degenerative disc disease and herniated discs, as well as an episode of pain radiating towards the groin, possibly implying nerve root compression.  Differential diagnosis:  Lumbar radiculopathy Lumbar disc herniation Lumbar spinal stenosis  Plan:  Order MRI of the lumbar spine without contrast to assess current state of disc disease and potential progression or new issues. Referral to orthopedics for further evaluation and management. Trial of Meloxicam 15 mg daily, in place of Naproxen, as an anti-inflammatory agent to manage pain and inflammation. Consider physical therapy as a conservative management option to improve back function and alleviate pain

## 2022-02-05 NOTE — Assessment & Plan Note (Signed)
The patient reports ongoing fatigue, excessive sleepiness, and a STOP-BANG score indicating an intermediate risk for obstructive sleep apnea.  Plan:  Ambulatory referral to Sleep Studies for a home sleep study to evaluate for sleep apnea. Comprehensive metabolic panel (CMP), thyroid-stimulating hormone (TSH) level, complete blood count (CBC) with differential, and urinalysis to assess for potential metabolic or hematologic causes of fatigue.

## 2022-02-06 ENCOUNTER — Other Ambulatory Visit: Payer: Self-pay | Admitting: Family Medicine

## 2022-02-06 DIAGNOSIS — G548 Other nerve root and plexus disorders: Secondary | ICD-10-CM

## 2022-02-06 DIAGNOSIS — M5106 Intervertebral disc disorders with myelopathy, lumbar region: Secondary | ICD-10-CM

## 2022-02-06 DIAGNOSIS — M4716 Other spondylosis with myelopathy, lumbar region: Secondary | ICD-10-CM

## 2022-02-17 ENCOUNTER — Ambulatory Visit: Payer: Commercial Managed Care - HMO | Admitting: Orthopedic Surgery

## 2022-02-28 ENCOUNTER — Telehealth: Payer: Self-pay | Admitting: Family Medicine

## 2022-02-28 ENCOUNTER — Ambulatory Visit: Payer: Commercial Managed Care - HMO | Admitting: Family Medicine

## 2022-02-28 NOTE — Telephone Encounter (Signed)
Pt was a same day no show because he has covid

## 2022-03-10 NOTE — Telephone Encounter (Signed)
No charge/not counted as no show due to pt having covid

## 2022-03-20 ENCOUNTER — Ambulatory Visit: Payer: Commercial Managed Care - HMO | Admitting: Orthopedic Surgery

## 2022-12-05 ENCOUNTER — Ambulatory Visit: Payer: Self-pay | Admitting: Family Medicine

## 2022-12-05 ENCOUNTER — Telehealth: Payer: Self-pay | Admitting: Family Medicine

## 2022-12-05 NOTE — Telephone Encounter (Signed)
Pt was a no show for an OV with Dr Janee Morn on 12/05/22, no letter.

## 2022-12-08 NOTE — Telephone Encounter (Signed)
1st no show, letter sent via [facility]

## 2023-06-29 ENCOUNTER — Ambulatory Visit: Payer: Self-pay | Admitting: Family Medicine

## 2023-07-03 ENCOUNTER — Ambulatory Visit (INDEPENDENT_AMBULATORY_CARE_PROVIDER_SITE_OTHER): Admitting: Podiatry

## 2023-07-03 DIAGNOSIS — Z91199 Patient's noncompliance with other medical treatment and regimen due to unspecified reason: Secondary | ICD-10-CM

## 2023-07-06 ENCOUNTER — Telehealth: Payer: Self-pay | Admitting: Family Medicine

## 2023-07-06 NOTE — Telephone Encounter (Signed)
 12/05/2022 no show 06/29/2023 no show  Final warning sent via mail and mychart

## 2023-07-06 NOTE — Progress Notes (Signed)
Patient did not show for scheduled appointment today.

## 2023-11-30 ENCOUNTER — Ambulatory Visit: Payer: Self-pay

## 2023-11-30 NOTE — Telephone Encounter (Signed)
 LVM for pt to cb. APPT available with PCP on 12/03/23.

## 2023-11-30 NOTE — Telephone Encounter (Signed)
 FYI Only or Action Required?: FYI only for provider: appointment scheduled on 12/04/23.  Patient was last seen in primary care on 01/31/2022 by Sebastian Beverley NOVAK, MD.  Called Nurse Triage reporting Back Pain.  Symptoms began several days ago.  Interventions attempted: OTC medications: Motrin  and Rest, hydration, or home remedies.  Symptoms are: gradually worsening.  Triage Disposition: See PCP When Office is Open (Within 3 Days)  Patient/caregiver understands and will follow disposition?: Yes       Copied from CRM [id number]. Topic: Clinical - Red Word Triage >> Nov 30, 2023 10:47 AM Avram MATSU wrote: Red Word that prompted transfer to Nurse Triage: back pain that's worsening    ----------------------------------------------------------------------- From previous Reason for Contact - Scheduling: Patient/patient representative is calling to schedule an appointment. Refer to attachments for appointment information. Reason for Disposition  [1] MODERATE back pain (e.g., interferes with normal activities) AND [2] present > 3 days  Answer Assessment - Initial Assessment Questions 1. ONSET: When did the pain begin? (e.g., minutes, hours, days)     Intermittently for about 2 years, periods of time where it is more severe 2. LOCATION: Where does it hurt? (upper, mid or lower back)     Mid back left/right 3. SEVERITY: How bad is the pain?  (e.g., Scale 1-10; mild, moderate, or severe)     9/10 4. PATTERN: Is the pain constant? (e.g., yes, no; constant, intermittent)      Constant for 3-7 days 5. RADIATION: Does the pain shoot into your legs or somewhere else?     No 6. CAUSE:  What do you think is causing the back pain?      Unknown, chronic with exacerbation  8. MEDICINES: What have you taken so far for the pain? (e.g., nothing, acetaminophen , NSAIDS)     Ibuprofen , mild relief to help sleep 9. NEUROLOGIC SYMPTOMS: Do you have any weakness, numbness, or problems with  bowel/bladder control?     None 10. OTHER SYMPTOMS: Do you have any other symptoms? (e.g., fever, abdomen pain, burning with urination, blood in urine)       None  Protocols used: Back Pain-A-AH

## 2023-12-03 ENCOUNTER — Ambulatory Visit: Admitting: Family Medicine

## 2023-12-03 ENCOUNTER — Encounter: Payer: Self-pay | Admitting: Family Medicine

## 2023-12-03 VITALS — BP 138/98 | HR 105 | Temp 97.0°F | Resp 18 | Wt 176.4 lb

## 2023-12-03 DIAGNOSIS — Z87448 Personal history of other diseases of urinary system: Secondary | ICD-10-CM | POA: Diagnosis not present

## 2023-12-03 DIAGNOSIS — M5116 Intervertebral disc disorders with radiculopathy, lumbar region: Secondary | ICD-10-CM | POA: Insufficient documentation

## 2023-12-03 MED ORDER — PREDNISONE 10 MG PO TABS: ORAL_TABLET | ORAL | 0 refills | Status: AC

## 2023-12-03 MED ORDER — DICLOFENAC SODIUM 75 MG PO TBEC: 75.0000 mg | DELAYED_RELEASE_TABLET | Freq: Two times a day (BID) | ORAL | 0 refills | Status: AC

## 2023-12-03 MED ORDER — TIZANIDINE HCL 4 MG PO TABS: 4.0000 mg | ORAL_TABLET | Freq: Four times a day (QID) | ORAL | 0 refills | Status: AC | PRN

## 2023-12-03 NOTE — Patient Instructions (Addendum)
 It was very nice to see you today!  VISIT SUMMARY: You visited us  today for chronic lower back pain that has been worsening over the past two years. We discussed your pain management options and agreed on a treatment plan, including medications and referrals to specialists.  YOUR PLAN: CHRONIC LOW BACK PAIN WITH LUMBAR DISC DISEASE: You have been experiencing chronic lower back pain for two years, with recent worsening. Previous imaging showed disc issues in your lower back. -We have prescribed a course of oral prednisone  (60, 50, 40, 30 mg taper) and diclofenac to be taken twice daily as needed for pain management. -You are referred to orthopedics for further evaluation and potential specialized imaging. -You are referred to physical therapy. -We discussed the potential use of tizanidine (Zanaflex) 4 mg as needed every 6 hours.  HISTORY OF NEPHROLITHIASIS: You have a history of kidney stones, with the last occurrence in 2023. You do not currently have symptoms suggestive of kidney stones. -We have ordered a urine analysis to rule out nephrolithiasis.  Return if symptoms worsen or fail to improve.   Take care, Arvella Hummer, MD, MS   PLEASE NOTE:  If you had any lab tests, please let us  know if you have not heard back within a few days. You may see your results on mychart before we have a chance to review them but we will give you a call once they are reviewed by us .   If we ordered any referrals today, please let us  know if you have not heard from their office within the next week.   If you had any urgent prescriptions sent in today, please check with the pharmacy within an hour of our visit to make sure the prescription was transmitted appropriately.   Please try these tips to maintain a healthy lifestyle:  Eat at least 3 REAL meals and 1-2 snacks per day.  Aim for no more than 5 hours between eating.  If you eat breakfast, please do so within one hour of getting up.   Each meal  should contain half fruits/vegetables, one quarter protein, and one quarter carbs (no bigger than a computer mouse)  Cut down on sweet beverages. This includes juice, soda, and sweet tea.   Drink at least 1 glass of water with each meal and aim for at least 8 glasses per day  Exercise at least 150 minutes every week.

## 2023-12-03 NOTE — Progress Notes (Signed)
 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 Recent exacerbation with pain rated at 9/10, constant, and localized to the mid-lower back without radiation, weakness, or numbness. Previous imaging in 2022 showed left subarticular disc at L5-S1 with left lateral recess compression on the left S1 nerve root, disc bulging, and foraminal stenosis at L3-L4 and L5-S1. No improvement with ibuprofen  or meloxicam . Muscle relaxers previously caused jitteriness. Declined ketorolac  or methylprednisolone injection. Agreed to referral to orthopedics and physical therapy. Agreed to a course of oral prednisone  and diclofenac for pain management. - Ordered urine analysis with microscopy with reflex to culture - Referred to orthopedics for further evaluation and potential specialized imaging - Referred to physical therapy - Prescribed prednisone  course pack (60, 50, 40, 30, 20, 10 mg taper) - Prescribed diclofenac 75 mg for pain management, to be taken twice daily as needed - Discussed potential use of tizanidine (Zanaflex) 4 mg as needed every 6 hours  History of nephrolithiasis Last episode in 2023 with a 7 mm right ureteral calculus and mild right hydronephrosis.  However, need to rule this out as a potential cause of pain.  Also cannot rule out urinary tract infection.  No hematuria reported.  Patient declines advanced imaging today. - Ordered urine analysis to rule out nephrolithiasis - Low threshold to order CT to assess for nephrolithiasis - Counseled on return precautions including worsening pain, fevers, chills, nausea, vomiting, hematuria, dysuria, or any other worrisome symptom,. - Recommend going to the emergency department if symptoms return severely.        Medications Discontinued During This Encounter  Medication Reason   meloxicam  (MOBIC ) 15  MG tablet     Return if symptoms worsen or fail to improve.        Subjective:   Encounter date: 12/03/2023  Brady Martin is a 48 y.o. male who has Renal calculus; Well adult exam; Chronic low back pain with sciatica; Fatigue; and Lumbar disc herniation with radiculopathy on their problem list..   He  has a past medical history of Kidney stones.SABRA   He presents with chief complaint of Back Pain (Pt c/o of lower back pain for 2 years; symptoms became worse overtime; no injury occurred. Pt used OTC goodie powders and ibuprofen . Pt stated occupation may have contributed to symptoms. //HM due- vaccinations and colonoscopy.  )  Discussed the use of AI scribe software for clinical note transcription with the patient, who gave verbal consent to proceed.  History of Present Illness Brady Martin is a 48 year old male who presents with chronic lower back pain.  Chronic lower back pain - Lower back pain present for two years with progressive worsening - Pain is constant with intermittent exacerbations, most recent beginning a couple of days ago - Pain severity rated as 9 out of 10 - Pain localized to the mid-lower back without radiation down either leg - No radiation, weakness, numbness, or numbness on the inside of the legs - No bowel or bladder incontinence - No referred abdominal pain, dysuria, or hematuria - No nausea or vomiting - Believes occupational factors may contribute to symptoms  Response to analgesic and anti-inflammatory medications - Over-the-counter Goody powders and ibuprofen  provide only mild relief - Ibuprofen  and Goody powders taken without improvement - Previous trial of meloxicam  without relief - Muscle relaxants cause significant jitteriness  History of nephrolithiasis -  History of kidney stones, last occurrence in 2023 with a 7 mm right urethral calculus and additional bilateral nonobstructing renal calculi - Does not believe current symptoms are  related to nephrolithiasis  Prior imaging findings - Imaging in 2022 showed left subarticular disc at L5-S1, disc bulging at L3-L4, and findings at L5-S1  ROS  Past Surgical History:  Procedure Laterality Date   KIDNEY STONE SURGERY      No current outpatient medications on file prior to visit.   No current facility-administered medications on file prior to visit.    History reviewed. No pertinent family history.  Social History   Socioeconomic History   Marital status: Divorced    Spouse name: Not on file   Number of children: Not on file   Years of education: Not on file   Highest education level: Not on file  Occupational History   Not on file  Tobacco Use   Smoking status: Former    Passive exposure: Never   Smokeless tobacco: Never  Vaping Use   Vaping status: Never Used  Substance and Sexual Activity   Alcohol use: No   Drug use: No   Sexual activity: Yes    Birth control/protection: None  Other Topics Concern   Not on file  Social History Narrative   Not on file   Social Drivers of Health   Financial Resource Strain: Not on file  Food Insecurity: Not on file  Transportation Needs: Not on file  Physical Activity: Not on file  Stress: Not on file  Social Connections: Unknown (07/15/2021)   [facility]   Social Network    Social Network: Not on file  Intimate Partner Violence: Unknown (07/15/2021)   Received from [facility]   HITS    Physically Hurt: Not on file    Insult or Talk Down To: Not on file    Threaten Physical Harm: Not on file    Scream or Curse: Not on file                                                                                                  Objective:  Physical Exam: BP (!) 138/98 (BP Location: Left Arm, Patient Position: Sitting, Cuff Size: Large)   Pulse (!) 105   Temp (!) 97 F (36.1 C) (Temporal)   Resp 18   Wt 176 lb 6.4 oz (80 kg)   SpO2 98%   BMI 31.25 kg/m    Physical Exam            Physical Exam Constitutional:      Appearance: Normal appearance.  HENT:     Head: Normocephalic and atraumatic.     Right Ear: Hearing normal.     Left Ear: Hearing normal.     Nose: Nose normal.  Eyes:     General: No scleral icterus.       Right eye: No discharge.        Left eye: No discharge.     Extraocular Movements: Extraocular movements intact.  Cardiovascular:     Rate and Rhythm: Normal rate  and regular rhythm.     Heart sounds: Normal heart sounds.  Pulmonary:     Effort: Pulmonary effort is normal.     Breath sounds: Normal breath sounds.  Abdominal:     Palpations: Abdomen is soft.     Tenderness: There is no abdominal tenderness.  Musculoskeletal:     Lumbar back: Tenderness present. No spasms.  Skin:    General: Skin is warm.     Findings: No rash.  Neurological:     General: No focal deficit present.     Mental Status: He is alert.     Cranial Nerves: No cranial nerve deficit.  Psychiatric:        Mood and Affect: Mood normal.        Behavior: Behavior normal.        Thought Content: Thought content normal.        Judgment: Judgment normal.     No results found.  No results found for this or any previous visit (from the past 2160 hours).      Beverley KATHEE Hummer, MD  I,Emily Lagle,acting as a scribe for Beverley KATHEE Hummer, MD.,have documented all relevant documentation on the behalf of Beverley KATHEE Hummer, MD.  LILLETTE Beverley KATHEE Hummer, MD, have reviewed all documentation for this visit. The documentation on 12/03/2023 for the exam, diagnosis, procedures, and orders are all accurate and complete.

## 2023-12-04 LAB — URINALYSIS W MICROSCOPIC + REFLEX CULTURE
Bacteria, UA: NONE SEEN /HPF
Bilirubin Urine: NEGATIVE
Glucose, UA: NEGATIVE
Hgb urine dipstick: NEGATIVE
Hyaline Cast: NONE SEEN /LPF
Leukocyte Esterase: NEGATIVE
Nitrites, Initial: NEGATIVE
RBC / HPF: NONE SEEN /HPF (ref 0–2)
Specific Gravity, Urine: 1.033 (ref 1.001–1.035)
Squamous Epithelial / HPF: NONE SEEN /HPF (ref <=5)
WBC, UA: NONE SEEN /HPF (ref 0–5)
pH: 5.5 (ref 5.0–8.0)

## 2023-12-04 LAB — NO CULTURE INDICATED

## 2023-12-06 ENCOUNTER — Ambulatory Visit: Payer: Self-pay | Admitting: Family Medicine

## 2023-12-17 ENCOUNTER — Other Ambulatory Visit: Payer: Self-pay | Admitting: Family Medicine

## 2023-12-21 ENCOUNTER — Other Ambulatory Visit: Payer: Self-pay

## 2023-12-21 ENCOUNTER — Ambulatory Visit: Admitting: Physical Medicine and Rehabilitation

## 2023-12-21 ENCOUNTER — Encounter: Payer: Self-pay | Admitting: Physical Medicine and Rehabilitation

## 2023-12-21 DIAGNOSIS — M47816 Spondylosis without myelopathy or radiculopathy, lumbar region: Secondary | ICD-10-CM

## 2023-12-21 DIAGNOSIS — G8929 Other chronic pain: Secondary | ICD-10-CM | POA: Diagnosis not present

## 2023-12-21 DIAGNOSIS — M47819 Spondylosis without myelopathy or radiculopathy, site unspecified: Secondary | ICD-10-CM | POA: Diagnosis not present

## 2023-12-21 DIAGNOSIS — M545 Low back pain, unspecified: Secondary | ICD-10-CM

## 2023-12-21 NOTE — Progress Notes (Unsigned)
 Core Outcome Measures Index (COMI) Back Score  Average Pain 8  COMI Score 80 %

## 2023-12-21 NOTE — Progress Notes (Unsigned)
 Pain Scale   Average Pain 8 Patient advising he has chronic lower back pain that is constant x 2 years on and off. No x-rays and no injuries to report.        +Driver, -BT, -Dye Allergies.

## 2023-12-21 NOTE — Progress Notes (Unsigned)
 Brady Martin - 48 y.o. male MRN [id number]  Date of birth: 12/09/1975  Office Visit Note: Visit Date: 12/21/2023 PCP: Sebastian Beverley NOVAK, MD Referred by: Sebastian Beverley NOVAK, MD  Subjective: Chief Complaint  Patient presents with   Lower Back - Pain   HPI: Brady Martin is a 48 y.o. male who comes in today per the request of Dr. Beverley Sebastian for evaluation of chronic, worsening and severe bilateral lower back pain. Pain ongoing for several years. Pain when moving from sitting to standing position, his pain is fairly constant. He describes pain as sharp and shooting sensation, currently rates as 8 out of 10. Some relief of pain with home exercise regimen, rest and use of medications. Minimal relief of pain with oral Voltaren . He is scheduled to start formal physical therapy for his lower back on 01/07/2024 at [facility].  CT of lumbar spine from 2022 shows multi level spondylolysis, there is probable left subarticular disc extrusion at L5-S1, encroaching upon the left lateral recess and potentially affecting the descending left S1 nerve root. No high grade spinal canal stenosis. No history of lumbar surgery/injections.       Review of Systems  Musculoskeletal:  Positive for back pain.  Neurological:  Negative for tingling, sensory change, focal weakness and weakness.  All other systems reviewed and are negative.  Otherwise per HPI.  Assessment & Plan: Visit Diagnoses:    ICD-10-CM   1. Chronic bilateral low back pain without sciatica  M54.50 XR Lumbar Spine 2-3 Views   G89.29     2. Spondylosis without myelopathy or radiculopathy  M47.819     3. Facet arthropathy, lumbar  M47.816        Plan: Findings:  Chronic, worsening and severe bilateral lower back pain. No radicular symptoms down the legs. Patient continues to have severe pain despite good conservative therapies such as home exercise regimen, rest and use of medications. Patients clinical presentation and exam are  complex, fits with more of facet mediated pain. He does have pain with lumbar extension today. I obtained lumbar radiographs in the office today that show multi level spondylosis, grade 1 anterolisthesis of L4 on L5. We discussed treatment plan in detail today. Given the chronicity of his pain and continued discomfort I placed order for lumbar MRI imaging. Depending on results of MRI imaging we discussed possibility of performing lumbar injections. I would also consider short course of formal physical therapy in the future. Patient has no questions at this time. We will see him back for lumbar MRI review.     Meds & Orders: No orders of the defined types were placed in this encounter.   Orders Placed This Encounter  Procedures   XR Lumbar Spine 2-3 Views    Follow-up: Return for Lumbar MRI review.   Procedures: No procedures performed      Clinical History: No specialty comments available.   He reports that he has quit smoking. He has never been exposed to tobacco smoke. He has never used smokeless tobacco. No results for input(s): HGBA1C, LABURIC in the last 8760 hours.  Objective:  VS:  HT:    WT:   BMI:     BP:   HR: bpm  TEMP: ( )  RESP:  Physical Exam Vitals and nursing note reviewed.  HENT:     Head: Normocephalic and atraumatic.     Right Ear: External ear normal.     Left Ear: External ear normal.  Nose: Nose normal.     Mouth/Throat:     Mouth: Mucous membranes are moist.  Eyes:     Extraocular Movements: Extraocular movements intact.  Cardiovascular:     Rate and Rhythm: Normal rate.     Pulses: Normal pulses.  Pulmonary:     Effort: Pulmonary effort is normal.  Abdominal:     General: Abdomen is flat. There is no distension.  Musculoskeletal:        General: Tenderness present.     Cervical back: Normal range of motion.     Comments: Patient rises from seated position to standing without difficulty. Pain noted with facet loading and lumbar extension.  5/5 strength noted with bilateral hip flexion, knee flexion/extension, ankle dorsiflexion/plantarflexion and EHL. No clonus noted bilaterally. No pain upon palpation of greater trochanters. No pain with internal/external rotation of bilateral hips. Sensation intact bilaterally. Negative slump test bilaterally. Ambulates without aid, gait steady.     Skin:    General: Skin is warm and dry.     Capillary Refill: Capillary refill takes less than 2 seconds.  Neurological:     General: No focal deficit present.     Mental Status: He is alert and oriented to person, place, and time.  Psychiatric:        Mood and Affect: Mood normal.        Behavior: Behavior normal.     Ortho Exam  Imaging: XR Lumbar Spine 2-3 Views Result Date: 12/21/2023 AP and lateral radiographs of lumbar spine shows slight curvature to the right, multi level spondylosis. There is grade 1 anterolisthesis of L4 on L5. Facet arthropathy to lower lumbar spine. Multi level anterior osteophyte formation. No fractures.    Past Medical/Family/Surgical/Social History: Medications & Allergies reviewed per EMR, new medications updated. Patient Active Problem List   Diagnosis Date Noted   Lumbar disc herniation with radiculopathy 12/03/2023   Chronic low back pain with sciatica 02/05/2022   Fatigue 02/05/2022   Well adult exam 12/18/2017   Renal calculus 12/11/2017   Past Medical History:  Diagnosis Date   Kidney stones    History reviewed. No pertinent family history. Past Surgical History:  Procedure Laterality Date   KIDNEY STONE SURGERY     Social History   Occupational History   Not on file  Tobacco Use   Smoking status: Former    Passive exposure: Never   Smokeless tobacco: Never  Vaping Use   Vaping status: Never Used  Substance and Sexual Activity   Alcohol use: No   Drug use: No   Sexual activity: Yes    Birth control/protection: None

## 2024-01-07 ENCOUNTER — Ambulatory Visit: Attending: Family Medicine

## 2024-01-22 ENCOUNTER — Ambulatory Visit: Admission: EM | Admit: 2024-01-22 | Discharge: 2024-01-22 | Disposition: A | Discharge status: Home / Self Care

## 2024-01-22 ENCOUNTER — Encounter: Payer: Self-pay | Admitting: Emergency Medicine

## 2024-01-22 ENCOUNTER — Ambulatory Visit (INDEPENDENT_AMBULATORY_CARE_PROVIDER_SITE_OTHER)

## 2024-01-22 DIAGNOSIS — R059 Cough, unspecified: Secondary | ICD-10-CM | POA: Diagnosis not present

## 2024-01-22 DIAGNOSIS — J209 Acute bronchitis, unspecified: Secondary | ICD-10-CM | POA: Diagnosis not present

## 2024-01-22 MED ORDER — GUAIFENESIN ER 600 MG PO TB12: 600.0000 mg | ORAL_TABLET | Freq: Two times a day (BID) | ORAL | 0 refills | Status: AC

## 2024-01-22 MED ORDER — PREDNISONE 50 MG PO TABS: ORAL_TABLET | ORAL | 0 refills | Status: AC

## 2024-01-22 MED ORDER — BENZONATATE 100 MG PO CAPS: 100.0000 mg | ORAL_CAPSULE | Freq: Three times a day (TID) | ORAL | 0 refills | Status: AC

## 2024-01-22 NOTE — ED Provider Notes (Signed)
 " Brady Martin    CSN: [id number] Arrival date & time: 01/22/24  9177      History   Chief Complaint Chief Complaint  Patient presents with   Facial Pain   Nasal Congestion   chest congestion    Cough    HPI Brady Martin is a 48 y.o. male.   Pt presents today due to cough productive of black sputum for one week. Pt states that his highest temp at home has been 100.0, patient has had known sick contact with the flu. Pt states that he is experiencing nausea but denies vomiting. Pt states that he is drinking but not eating.   The history is provided by the patient.  Cough   Past Medical History:  Diagnosis Date   Kidney stones     Patient Active Problem List   Diagnosis Date Noted   Lumbar disc herniation with radiculopathy 12/03/2023   Chronic low back pain with sciatica 02/05/2022   Fatigue 02/05/2022   Well adult exam 12/18/2017   Renal calculus 12/11/2017    Past Surgical History:  Procedure Laterality Date   KIDNEY STONE SURGERY         Home Medications    Prior to Admission medications  Medication Sig Start Date End Date Taking? Authorizing Provider  diclofenac  (VOLTAREN ) 75 MG EC tablet Take 1 tablet (75 mg total) by mouth 2 (two) times daily. 12/03/23  Yes Sebastian Beverley NOVAK, MD  tiZANidine  (ZANAFLEX ) 4 MG tablet Take 1 tablet (4 mg total) by mouth every 6 (six) hours as needed for muscle spasms. 12/03/23  Yes Sebastian Beverley NOVAK, MD  pantoprazole (PROTONIX) 40 MG tablet Take 40 mg by mouth. Patient not taking: Reported on 01/22/2024 03/31/22   [provider]    Family History History reviewed. No pertinent family history.  Social History Social History[1]   Allergies   Wheat, Egg protein-containing drug products, Milk-related compounds, and Soy allergy (obsolete)   Review of Systems Review of Systems  Respiratory:  Positive for cough.      Physical Exam Triage Vital Signs ED Triage Vitals  Encounter Vitals Group      BP 01/22/24 0907 (!) 125/90     Girls Systolic BP Percentile --      Girls Diastolic BP Percentile --      Boys Systolic BP Percentile --      Boys Diastolic BP Percentile --      Pulse Rate 01/22/24 0907 92     Resp 01/22/24 0907 18     Temp 01/22/24 0907 98.1 F (36.7 C)     Temp Source 01/22/24 0907 Oral     SpO2 01/22/24 0907 95 %     Weight --      Height --      Head Circumference --      Peak Flow --      Pain Score 01/22/24 0916 7     Pain Loc --      Pain Education --      Exclude from Growth Chart --    No data found.  Updated Vital Signs BP (!) 125/90 (BP Location: Left Arm)   Pulse 92   Temp 98.1 F (36.7 C) (Oral)   Resp 18   SpO2 95%   Visual Acuity Right Eye Distance:   Left Eye Distance:   Bilateral Distance:    Right Eye Near:   Left Eye Near:    Bilateral Near:     Physical  Exam Vitals and nursing note reviewed.  Constitutional:      General: He is not in acute distress.    Appearance: Normal appearance. He is not ill-appearing, toxic-appearing or diaphoretic.  HENT:     Nose: Congestion (moderately enlarged turbinates) present. No rhinorrhea.     Mouth/Throat:     Mouth: Mucous membranes are moist.     Pharynx: Oropharynx is clear. No oropharyngeal exudate or posterior oropharyngeal erythema.  Eyes:     General: No scleral icterus. Cardiovascular:     Rate and Rhythm: Normal rate and regular rhythm.     Heart sounds: Normal heart sounds.  Pulmonary:     Effort: Pulmonary effort is normal. No respiratory distress.     Breath sounds: Normal breath sounds. No wheezing or rhonchi.  Skin:    General: Skin is warm.  Neurological:     Mental Status: He is alert and oriented to person, place, and time.  Psychiatric:        Mood and Affect: Mood normal.        Behavior: Behavior normal.      UC Treatments / Results  Labs (all labs ordered are listed, but only abnormal results are displayed) Labs Reviewed - No data to  display  EKG   Radiology No results found.  Procedures Procedures (including critical Martin time)  Medications Ordered in UC Medications - No data to display  Initial Impression / Assessment and Plan / UC Course  I have reviewed the triage vital signs and the nursing notes.  Pertinent labs & imaging results that were available during my Martin of the patient were reviewed by me and considered in my medical decision making (see chart for details).     Final Clinical Impressions(s) / UC Diagnoses   Final diagnoses:  Cough, unspecified type   Discharge Instructions   None    ED Prescriptions   None    PDMP not reviewed this encounter.    [1]  Social History Tobacco Use   Smoking status: Former    Passive exposure: Never   Smokeless tobacco: Never  Vaping Use   Vaping status: Never Used  Substance Use Topics   Alcohol use: No   Drug use: No     Andra Corean BROCKS, PA-C 01/22/24 [id number]  "

## 2024-01-22 NOTE — ED Triage Notes (Signed)
 Pt reports chest congestion, productive cough w/ black sputum, sinus pressure/pain, and nasal congestion x 1 week. Pt has taken theraflu and ibuprofen  with little relief. Family members have been sick - one tested positive for flu. Pt reports temperature earlier this week - 100.0.

## 2024-01-22 NOTE — Discharge Instructions (Addendum)
 Preliminary review of chest xray shows no signs of pneumonia. If radiologist says something different we will call you and let you know.
